# Patient Record
Sex: Female | Born: 1965 | Race: Black or African American | Hispanic: No | Marital: Married | Smoking: Former smoker
Health system: Southern US, Community
[De-identification: ages and names within clinical notes are randomized; demographics above are authoritative.]

## PROBLEM LIST (undated history)

## (undated) DIAGNOSIS — E119 Type 2 diabetes mellitus without complications: Secondary | ICD-10-CM

## (undated) DIAGNOSIS — J309 Allergic rhinitis, unspecified: Secondary | ICD-10-CM

## (undated) DIAGNOSIS — E785 Hyperlipidemia, unspecified: Secondary | ICD-10-CM

## (undated) DIAGNOSIS — M7710 Lateral epicondylitis, unspecified elbow: Secondary | ICD-10-CM

## (undated) DIAGNOSIS — A6000 Herpesviral infection of urogenital system, unspecified: Secondary | ICD-10-CM

## (undated) DIAGNOSIS — N920 Excessive and frequent menstruation with regular cycle: Secondary | ICD-10-CM

## (undated) DIAGNOSIS — Z124 Encounter for screening for malignant neoplasm of cervix: Secondary | ICD-10-CM

## (undated) DIAGNOSIS — E663 Overweight: Secondary | ICD-10-CM

## (undated) DIAGNOSIS — K449 Diaphragmatic hernia without obstruction or gangrene: Secondary | ICD-10-CM

## (undated) DIAGNOSIS — D219 Benign neoplasm of connective and other soft tissue, unspecified: Secondary | ICD-10-CM

## (undated) HISTORY — DX: Herpesviral infection of urogenital system, unspecified: A60.00

## (undated) HISTORY — DX: Allergic rhinitis, unspecified: J30.9

## (undated) HISTORY — DX: Hyperlipidemia, unspecified: E78.5

## (undated) HISTORY — PX: NASAL POLYP EXCISION: SHX2068

## (undated) HISTORY — PX: GYNECOLOGIC CRYOSURGERY: SHX857

## (undated) HISTORY — DX: Benign neoplasm of connective and other soft tissue, unspecified: D21.9

## (undated) HISTORY — DX: Excessive and frequent menstruation with regular cycle: N92.0

## (undated) HISTORY — DX: Lateral epicondylitis, unspecified elbow: M77.10

## (undated) HISTORY — DX: Encounter for screening for malignant neoplasm of cervix: Z12.4

## (undated) HISTORY — PX: TUBAL LIGATION: SHX77

## (undated) HISTORY — DX: Overweight: E66.3

## (undated) HISTORY — PX: ABDOMINAL HYSTERECTOMY: SHX81

## (undated) HISTORY — DX: Diaphragmatic hernia without obstruction or gangrene: K44.9

## (undated) HISTORY — PX: PELVIC LAPAROSCOPY: SHX162

---

## 2010-02-28 HISTORY — PX: COLONOSCOPY: SHX174

## 2011-03-01 LAB — HM COLONOSCOPY

## 2012-07-20 HISTORY — PX: ESOPHAGOGASTRODUODENOSCOPY: SHX1529

## 2012-08-27 ENCOUNTER — Emergency Department (HOSPITAL_BASED_OUTPATIENT_CLINIC_OR_DEPARTMENT_OTHER)
Admission: EM | Admit: 2012-08-27 | Discharge: 2012-08-28 | Disposition: A | Discharge status: Home / Self Care | Payer: BC Managed Care – PPO | Attending: Emergency Medicine | Admitting: Emergency Medicine

## 2012-08-27 ENCOUNTER — Encounter (HOSPITAL_BASED_OUTPATIENT_CLINIC_OR_DEPARTMENT_OTHER): Payer: Self-pay

## 2012-08-27 ENCOUNTER — Emergency Department (HOSPITAL_BASED_OUTPATIENT_CLINIC_OR_DEPARTMENT_OTHER): Payer: BC Managed Care – PPO

## 2012-08-27 DIAGNOSIS — S90129A Contusion of unspecified lesser toe(s) without damage to nail, initial encounter: Secondary | ICD-10-CM | POA: Insufficient documentation

## 2012-08-27 DIAGNOSIS — F17200 Nicotine dependence, unspecified, uncomplicated: Secondary | ICD-10-CM | POA: Insufficient documentation

## 2012-08-27 DIAGNOSIS — M719 Bursopathy, unspecified: Secondary | ICD-10-CM | POA: Insufficient documentation

## 2012-08-27 DIAGNOSIS — X58XXXA Exposure to other specified factors, initial encounter: Secondary | ICD-10-CM | POA: Insufficient documentation

## 2012-08-27 DIAGNOSIS — Y929 Unspecified place or not applicable: Secondary | ICD-10-CM | POA: Insufficient documentation

## 2012-08-27 DIAGNOSIS — Y9341 Activity, dancing: Secondary | ICD-10-CM | POA: Insufficient documentation

## 2012-08-27 DIAGNOSIS — M67919 Unspecified disorder of synovium and tendon, unspecified shoulder: Secondary | ICD-10-CM | POA: Insufficient documentation

## 2012-08-27 DIAGNOSIS — S90121A Contusion of right lesser toe(s) without damage to nail, initial encounter: Secondary | ICD-10-CM

## 2012-08-27 LAB — CBC WITH DIFFERENTIAL/PLATELET
Basophils Relative: 0 % (ref 0–1)
Eosinophils Absolute: 0.3 10*3/uL (ref 0.0–0.7)
HCT: 35 % — ABNORMAL LOW (ref 36.0–46.0)
Hemoglobin: 11.8 g/dL — ABNORMAL LOW (ref 12.0–15.0)
MCH: 28.5 pg (ref 26.0–34.0)
MCHC: 33.7 g/dL (ref 30.0–36.0)
MCV: 84.5 fL (ref 78.0–100.0)
Monocytes Absolute: 0.6 10*3/uL (ref 0.1–1.0)
Monocytes Relative: 8 % (ref 3–12)
Neutro Abs: 4 10*3/uL (ref 1.7–7.7)
RBC: 4.14 MIL/uL (ref 3.87–5.11)
WBC: 7.3 10*3/uL (ref 4.0–10.5)

## 2012-08-27 NOTE — ED Notes (Signed)
Right great toe pain x 2-3 months-? Injured during dance-c/o pain to right arm x 2 weeks-denies injury

## 2012-08-28 LAB — BASIC METABOLIC PANEL
BUN: 14 mg/dL (ref 6–23)
GFR calc non Af Amer: 74 mL/min — ABNORMAL LOW (ref >90)
Glucose, Bld: 105 mg/dL — ABNORMAL HIGH (ref 70–99)
Potassium: 3.7 mEq/L (ref 3.5–5.1)

## 2012-08-28 MED ORDER — HYDROCODONE-ACETAMINOPHEN 5-325 MG PO TABS: 2.0000 | ORAL_TABLET | ORAL | Status: DC | PRN

## 2012-08-28 NOTE — ED Provider Notes (Signed)
Medical screening examination/treatment/procedure(s) were performed by non-physician practitioner and as supervising physician I was immediately available for consultation/collaboration.   Glynn Octave, MD 08/28/12 [phone]

## 2012-08-28 NOTE — ED Provider Notes (Signed)
History    CSN: [id number] Arrival date & time 08/27/12  2134  First MD Initiated Contact with Patient 08/27/12 2221     Chief Complaint  Patient presents with  . Toe Pain  . Arm Pain   (Consider location/radiation/quality/duration/timing/severity/associated sxs/prior Treatment) Patient is a 47 y.o. female presenting with toe pain and arm pain. The history is provided by the patient. No language interpreter was used.  Toe Pain Chronicity: 2 months  The current episode started today. The problem occurs constantly. The problem has been gradually worsening. Associated symptoms include myalgias. Nothing aggravates the symptoms. She has tried nothing for the symptoms. The treatment provided moderate relief.  Arm Pain Associated symptoms include myalgias.  Pt reports arm pain for the past 2 weeks Pt reports soreness with strightening out arm.  Pt complains of pain in elbow with moving.  No injury of elbow.   Pt reports she injured toe dancing 2 months ago damcing History reviewed. No pertinent past medical history. History reviewed. No pertinent past surgical history. No family history on file. History  Substance Use Topics  . Smoking status: Current Some Day Smoker  . Smokeless tobacco: Not on file  . Alcohol Use: No   OB History   Grav Para Term Preterm Abortions TAB SAB Ect Mult Living                 Review of Systems  Musculoskeletal: Positive for myalgias.  All other systems reviewed and are negative.    Allergies  Advil; Aspirin; and Darvon  Home Medications  No current outpatient prescriptions on file. BP 103/65  Pulse 84  Temp(Src) 99 F (37.2 C) (Oral)  Resp 18  Ht 5\' 6"  (1.676 m)  Wt 180 lb (81.647 kg)  BMI 29.07 kg/m2  SpO2 99%  LMP 08/06/2012 Physical Exam  Nursing note and vitals reviewed. Constitutional: She is oriented to person, place, and time. She appears well-developed.  HENT:  Head: Normocephalic and atraumatic.  Eyes: Conjunctivae are  normal. Pupils are equal, round, and reactive to light.  Neck: Normal range of motion.  Cardiovascular: Normal rate.   Musculoskeletal: She exhibits tenderness.  Tender right toe,  Tender right elbow from,  nv and ns intact,  Pain with range of motion  Neurological: She is alert and oriented to person, place, and time.  Skin: Skin is warm.  Psychiatric: She has a normal mood and affect.    ED Course  Procedures (including critical care time) Labs Reviewed  CBC WITH DIFFERENTIAL - Abnormal; Notable for the following:    Hemoglobin 11.8 (*)    HCT 35.0 (*)    RDW 16.4 (*)    All other components within normal limits  D-DIMER, QUANTITATIVE  BASIC METABOLIC PANEL   Dg Toe Great Right  08/27/2012   *RADIOLOGY REPORT*  Clinical Data: Right great toe pain for 2-3 months.  RIGHT GREAT TOE  Comparison: None.  Findings: Imaged bones, joints and soft tissues appear normal.  IMPRESSION: Negative exam.   Original Report Authenticated By: Holley Dexter, M.D.   1. Tendonitis of shoulder, right   2. Contusion of toe of right foot, initial encounter     MDM  Pt worried about having a blood clot.  ddimer normal,  I suspect tendonitis.   Toe no fracture.   Pt given rx for  hydrocodone   Pt advised to follow up with Dr. Pearletha Forge for recheck in 1 week.  Sling for arm,  ice  Lonia Skinner [place],  PA-C 08/28/12 1610

## 2012-11-06 DIAGNOSIS — Z124 Encounter for screening for malignant neoplasm of cervix: Secondary | ICD-10-CM

## 2012-11-06 HISTORY — DX: Encounter for screening for malignant neoplasm of cervix: Z12.4

## 2012-12-29 LAB — HM MAMMOGRAPHY

## 2012-12-29 LAB — HM PAP SMEAR

## 2013-03-21 ENCOUNTER — Ambulatory Visit (INDEPENDENT_AMBULATORY_CARE_PROVIDER_SITE_OTHER): Payer: BC Managed Care – PPO | Admitting: Family Medicine

## 2013-03-21 ENCOUNTER — Encounter: Payer: Self-pay | Admitting: Family Medicine

## 2013-03-21 VITALS — BP 109/77 | HR 76 | Resp 16 | Ht 68.75 in | Wt 198.0 lb

## 2013-03-21 DIAGNOSIS — R1012 Left upper quadrant pain: Secondary | ICD-10-CM

## 2013-03-21 DIAGNOSIS — N760 Acute vaginitis: Secondary | ICD-10-CM

## 2013-03-21 NOTE — Progress Notes (Signed)
Subjective:    Patient ID: Anne [name], female    DOB: March 27, 1965, 48 y.o.   MRN: [id number]  HPI  Anne [name] is here today to establish care with our practice.  Her assistant found our practice and scheduled her appointment.  She previously received her care from several of the providers at [facility] . She has not been entirely satisfied with the care she has received there in the past and wanted to get an additional opinion elsewhere.  Her biggest concern/complaint is some discomfort she has had in her upper left abdomen for several years. She has had numerous tests done and nothing significant has been found.  She says that she has been treated for H. Pylori a couple of times.  She has also been diagnosed with a hiatal hernia.  She has been frustrated because several providers have suggested that her LUQ pain is related somehow to the fat present on her abdomen.  She says that today the pain is radiating to her back. She has also been having some very heavy periods.  She had a transabdominal and transvaginal U/S of the pelvis on 10/11/12 which showed some uterine fibroid with the largest one measuring 1.7 x 1.3 x 1.6 cm. The ovaries/adnexa were WNL.      Review of Systems  Gastrointestinal: Positive for abdominal pain.       LUQ   Genitourinary: Positive for vaginal bleeding and pelvic pain.  All other systems reviewed and are negative.    Past Medical History  Diagnosis Date  . Hiatal hernia   . Pap smear for cervical cancer screening 11/06/2012    HPV (Negative) [facility]  . Allergic rhinitis   . Genital herpes   . Hyperlipidemia   . Lateral epicondylitis   . Overweight   . Polymenorrhea   . Fibroids      Past Surgical History  Procedure Laterality Date  . Pelvic laparoscopy      Uterine  . Nasal polyp excision    . Gynecologic cryosurgery    . Esophagogastroduodenoscopy  07/20/2012  . Tubal ligation    . 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  She quit on 02/20/13.   Alcohol Use:  Rarely    Drug Use:  None   Diet:  Regular    Exercise:  3 x per week Paediatric nurse)    Hobbies: Dance                  Family History  Problem Relation Age of Onset  . Seizures Mother   . Stroke Brother   . Arthritis Maternal Grandmother   . Diabetes Maternal Grandmother   . Heart disease Maternal Grandmother   . Hyperlipidemia Maternal Grandmother   . Hypertension Maternal Grandmother   . Congestive Heart Failure Maternal Grandfather   . Diabetes Maternal Grandfather       Allergies  Allergen Reactions  . Advil [Ibuprofen] Rash  . Aspirin Rash  . Darvon [Propoxyphene Hcl] Rash  . Penicillins Rash       Objective:  Physical Exam  Constitutional: No distress.  HENT:  Nose: Nose normal.  Mouth/Throat: Oropharynx is clear and moist. Mucous membranes are dry.  Eyes: No scleral icterus.  Neck: Neck supple.  Cardiovascular: Normal rate, regular rhythm and normal heart sounds.   Pulmonary/Chest: Effort normal and breath sounds normal.  Abdominal: Soft. Bowel sounds are normal. She exhibits no distension and no mass. There is tenderness (Mild discomfort in LUQ. ). There is no rebound and no guarding.  Musculoskeletal: She exhibits no edema.  Lymphadenopathy:    She has no cervical adenopathy.  Neurological: She is alert.  Skin: Skin is warm and dry. She is not diaphoretic.  Psychiatric: She has a normal mood and affect. Her behavior is normal. Judgment and thought content normal.          Assessment & Plan:

## 2013-03-21 NOTE — Patient Instructions (Signed)
1)  Abdominal Pain - ? IBS vs Splenic Flexure; Try Align (1 + month)  2)  Vaginitis - RePhresh Products  3)  GYN - We're getting records to see what has been done and what needs to be done.    Diet and Irritable Bowel Syndrome  No cure has been found for irritable bowel syndrome (IBS). Many options are available to treat the symptoms. Your caregiver will give you the best treatments available for your symptoms. He or she will also encourage you to manage stress and to make changes to your diet. You need to work with your caregiver and Registered Dietician to find the best combination of medicine, diet, counseling, and support to control your symptoms. The following are some diet suggestions. FOODS THAT MAKE IBS WORSE  Fatty foods, such as [place] fries.  Milk products, such as cheese or ice cream.  Chocolate.  Alcohol.  Caffeine (found in coffee and some sodas).  Carbonated drinks, such as soda. If certain foods cause symptoms, you should eat less of them or stop eating them. FOOD JOURNAL   Keep a journal of the foods that seem to cause distress. Write down:  What you are eating during the day and when.  What problems you are having after eating.  When the symptoms occur in relation to your meals.  What foods always make you feel badly.  Take your notes with you to your caregiver to see if you should stop eating certain foods. FOODS THAT MAKE IBS BETTER Fiber reduces IBS symptoms, especially constipation, because it makes stools soft, bulky, and easier to pass. Fiber is found in bran, bread, cereal, beans, fruit, and vegetables. Examples of foods with fiber include:  Apples.  Peaches.  Pears.  Berries.  Figs.  Broccoli, raw.  Cabbage.  Carrots.  Raw peas.  Kidney beans.  Lima beans.  Whole-grain bread.  Whole-grain cereal. Add foods with fiber to your diet a little at a time. This will let your body get used to them. Too much fiber at once might cause  gas and swelling of your abdomen. This can trigger symptoms in a person with IBS. Caregivers usually recommend a diet with enough fiber to produce soft, painless bowel movements. High fiber diets may cause gas and bloating. However, these symptoms often go away within a few weeks, as your body adjusts. In many cases, dietary fiber may lessen IBS symptoms, particularly constipation. However, it may not help pain or diarrhea. High fiber diets keep the colon mildly enlarged (distended) with the added fiber. This may help prevent spasms in the colon. Some forms of fiber also keep water in the stool, thereby preventing hard stools that are difficult to pass.  Besides telling you to eat more foods with fiber, your caregiver may also tell you to get more fiber by taking a fiber pill or drinking water mixed with a special high fiber powder. An example of this is a natural fiber laxative containing psyllium seed.  TIPS  Large meals can cause cramping and diarrhea in people with IBS. If this happens to you, try eating 4 or 5 small meals a day, or try eating less at each of your usual 3 meals. It may also help if your meals are low in fat and high in carbohydrates. Examples of carbohydrates are pasta, rice, whole-grain breads and cereals, fruits, and vegetables.  If dairy products cause your symptoms to flare up, you can try eating less of those foods. You might be able to  handle yogurt better than other dairy products, because it contains bacteria that helps with digestion. Dairy products are an important source of calcium and other nutrients. If you need to avoid dairy products, be sure to talk with a Registered Dietitian about getting these nutrients through other food sources.  Drink enough water and fluids to keep your urine clear or pale yellow. This is important, especially if you have diarrhea. FOR MORE INFORMATION  International Foundation for Functional Gastrointestinal Disorders: www.iffgd.org  National  Digestive Diseases Information Clearinghouse: digestive.AmenCredit.is Document Released: 05/07/2003 Document Revised: 05/09/2011 Document Reviewed: 01/22/2007 [facility] Patient Information 2014 [place], [place].

## 2013-04-18 ENCOUNTER — Encounter: Payer: Self-pay | Admitting: Family Medicine

## 2013-04-19 ENCOUNTER — Ambulatory Visit (INDEPENDENT_AMBULATORY_CARE_PROVIDER_SITE_OTHER): Payer: BC Managed Care – PPO | Admitting: Family Medicine

## 2013-04-19 ENCOUNTER — Encounter: Payer: Self-pay | Admitting: Family Medicine

## 2013-04-19 ENCOUNTER — Ambulatory Visit (HOSPITAL_BASED_OUTPATIENT_CLINIC_OR_DEPARTMENT_OTHER)
Admission: RE | Admit: 2013-04-19 | Discharge: 2013-04-19 | Disposition: A | Discharge status: Home / Self Care | Payer: BC Managed Care – PPO | Source: Ambulatory Visit | Attending: Family Medicine | Admitting: Family Medicine

## 2013-04-19 VITALS — BP 108/76 | HR 75 | Resp 16 | Wt 197.0 lb

## 2013-04-19 DIAGNOSIS — R0602 Shortness of breath: Secondary | ICD-10-CM | POA: Insufficient documentation

## 2013-04-19 DIAGNOSIS — F411 Generalized anxiety disorder: Secondary | ICD-10-CM

## 2013-04-19 MED ORDER — HYDROXYZINE PAMOATE 25 MG PO CAPS: ORAL_CAPSULE | ORAL | Status: DC

## 2013-04-19 MED ORDER — SERTRALINE HCL 50 MG PO TABS
50.0000 mg | ORAL_TABLET | Freq: Every day | ORAL | Status: DC
Start: 2013-04-19 — End: 2013-05-27

## 2013-04-19 MED ORDER — UMECLIDINIUM-VILANTEROL 62.5-25 MCG/INH IN AEPB
1.0000 | INHALATION_SPRAY | Freq: Every day | RESPIRATORY_TRACT | Status: AC
Start: 2013-04-19 — End: 2014-04-19

## 2013-04-19 NOTE — Patient Instructions (Addendum)
1)  Panic Attack - Zoloft daily vs Vistaril as needed or both (Zoloft - start with 1/2 tab for 6 days then go to 1 tab - the next month take 1 or 2)  F/U in 6 weeks   2)  Shortness of Breath - Try the Anoro 1 puff daily  Panic Attacks Panic attacks are sudden, short-livedsurges of severe anxiety, fear, or discomfort. They may occur for no reason when you are relaxed, when you are anxious, or when you are sleeping. Panic attacks may occur for a number of reasons:   Healthy people occasionally have panic attacks in extreme, life-threatening situations, such as war or natural disasters. Normal anxiety is a protective mechanism of the body that helps [place] react to danger (fight or flight response).  Panic attacks are often seen with anxiety disorders, such as panic disorder, social anxiety disorder, generalized anxiety disorder, and phobias. Anxiety disorders cause excessive or uncontrollable anxiety. They may interfere with your relationships or other life activities.  Panic attacks are sometimes seen with other mental illnesses such as depression and posttraumatic stress disorder.  Certain medical conditions, prescription medicines, and drugs of abuse can cause panic attacks. SYMPTOMS  Panic attacks start suddenly, peak within 20 minutes, and are accompanied by four or more of the following symptoms:  Pounding heart or fast heart rate (palpitations).  Sweating.  Trembling or shaking.  Shortness of breath or feeling smothered.  Feeling choked.  Chest pain or discomfort.  Nausea or strange feeling in your stomach.  Dizziness, lightheadedness, or feeling like you will faint.  Chills or hot flushes.  Numbness or tingling in your lips or hands and feet.  Feeling that things are not real or feeling that you are not yourself.  Fear of losing control or going crazy.  Fear of dying. Some of these symptoms can mimic serious medical conditions. For example, you may think you are having a  heart attack. Although panic attacks can be very scary, they are not life threatening. DIAGNOSIS  Panic attacks are diagnosed through an assessment by your health care provider. Your health care provider will ask questions about your symptoms, such as where and when they occurred. Your health care provider will also ask about your medical history and use of alcohol and drugs, including prescription medicines. Your health care provider may order blood tests or other studies to rule out a serious medical condition. Your health care provider may refer you to a mental health professional for further evaluation. TREATMENT   Most healthy people who have one or two panic attacks in an extreme, life-threatening situation will not require treatment.  The treatment for panic attacks associated with anxiety disorders or other mental illness typically involves counseling with a mental health professional, medicine, or a combination of both. Your health care provider will help determine what treatment is best for you.  Panic attacks due to physical illness usually goes away with treatment of the illness. If prescription medicine is causing panic attacks, talk with your health care provider about stopping the medicine, decreasing the dose, or substituting another medicine.  Panic attacks due to alcohol or drug abuse goes away with abstinence. Some adults need professional help in order to stop drinking or using drugs. HOME CARE INSTRUCTIONS   Take all your medicines as prescribed.   Check with your health care provider before starting new prescription or over-the-counter medicines.  Keep all follow up appointments with your health care provider. SEEK MEDICAL CARE IF:  You are  not able to take your medicines as prescribed.  Your symptoms do not improve or get worse. SEEK IMMEDIATE MEDICAL CARE IF:   You experience panic attack symptoms that are different than your usual symptoms.  You have serious  thoughts about hurting yourself or others.  You are taking medicine for panic attacks and have a serious side effect. MAKE SURE YOU:  Understand these instructions.  Will watch your condition.  Will get help right away if you are not doing well or get worse. Document Released: 02/14/2005 Document Revised: 12/05/2012 Document Reviewed: 09/28/2012 [facility] Patient Information 2014 [place].  Smoking Cessation Quitting smoking is important to your health and has many advantages. However, it is not always easy to quit since nicotine is a very addictive drug. Often times, people try 3 times or more before being able to quit. This document explains the best ways for you to prepare to quit smoking. Quitting takes hard work and a lot of effort, but you can do it. ADVANTAGES OF QUITTING SMOKING  You will live longer, feel better, and live better.  Your body will feel the impact of quitting smoking almost immediately.  Within 20 minutes, blood pressure decreases. Your pulse returns to its normal level.  After 8 hours, carbon monoxide levels in the blood return to normal. Your oxygen level increases.  After 24 hours, the chance of having a heart attack starts to decrease. Your breath, hair, and body stop smelling like smoke.  After 48 hours, damaged nerve endings begin to recover. Your sense of taste and smell improve.  After 72 hours, the body is virtually free of nicotine. Your bronchial tubes relax and breathing becomes easier.  After 2 to 12 weeks, lungs can hold more air. Exercise becomes easier and circulation improves.  The risk of having a heart attack, stroke, cancer, or lung disease is greatly reduced.  After 1 year, the risk of coronary heart disease is cut in half.  After 5 years, the risk of stroke falls to the same as a nonsmoker.  After 10 years, the risk of lung cancer is cut in half and the risk of other cancers decreases significantly.  After 15 years, the risk  of coronary heart disease drops, usually to the level of a nonsmoker.  If you are pregnant, quitting smoking will improve your chances of having a healthy baby.  The people you live with, especially any children, will be healthier.  You will have extra money to spend on things other than cigarettes. QUESTIONS TO THINK ABOUT BEFORE ATTEMPTING TO QUIT You may want to talk about your answers with your caregiver.  Why do you want to quit?  If you tried to quit in the past, what helped and what did not?  What will be the most difficult situations for you after you quit? How will you plan to handle them?  Who can help you through the tough times? Your family? Friends? A caregiver?  What pleasures do you get from smoking? What ways can you still get pleasure if you quit? Here are some questions to ask your caregiver:  How can you help me to be successful at quitting?  What medicine do you think would be best for me and how should I take it?  What should I do if I need more help?  What is smoking withdrawal like? How can I get information on withdrawal? GET READY  Set a quit date.  Change your environment by getting rid of all cigarettes,  ashtrays, matches, and lighters in your home, car, or work. Do not let people smoke in your home.  Review your past attempts to quit. Think about what worked and what did not. GET SUPPORT AND ENCOURAGEMENT You have a better chance of being successful if you have help. You can get support in many ways.  Tell your family, friends, and co-workers that you are going to quit and need their support. Ask them not to smoke around you.  Get individual, group, or telephone counseling and support. Programs are available at [employer] and health centers. Call your local health department for information about programs in your area.  Spiritual beliefs and practices may help some smokers quit.  Download a "quit meter" on your computer to keep track of  quit statistics, such as how long you have gone without smoking, cigarettes not smoked, and money saved.  Get a self-help book about quitting smoking and staying off of tobacco. [place] yourself from urges to smoke. Talk to someone, go for a walk, or occupy your time with a task.  Change your normal routine. Take a different route to work. Drink tea instead of coffee. Eat breakfast in a different place.  Reduce your stress. Take a hot bath, exercise, or read a book.  Plan something enjoyable to do every day. Reward yourself for not smoking.  Explore interactive web-based programs that specialize in helping you quit. GET MEDICINE AND USE IT CORRECTLY Medicines can help you stop smoking and decrease the urge to smoke. Combining medicine with the above behavioral methods and support can greatly increase your chances of successfully quitting smoking.  Nicotine replacement therapy helps deliver nicotine to your body without the negative effects and risks of smoking. Nicotine replacement therapy includes nicotine gum, lozenges, inhalers, nasal sprays, and skin patches. Some may be available over-the-counter and others require a prescription.  Antidepressant medicine helps people abstain from smoking, but how this works is unknown. This medicine is available by prescription.  Nicotinic receptor partial agonist medicine simulates the effect of nicotine in your brain. This medicine is available by prescription. Ask your caregiver for advice about which medicines to use and how to use them based on your health history. Your caregiver will tell you what side effects to look out for if you choose to be on a medicine or therapy. Carefully read the information on the package. Do not use any other product containing nicotine while using a nicotine replacement product.  RELAPSE OR DIFFICULT SITUATIONS Most relapses occur within the first 3 months after quitting. Do not be  discouraged if you start smoking again. Remember, most people try several times before finally quitting. You may have symptoms of withdrawal because your body is used to nicotine. You may crave cigarettes, be irritable, feel very hungry, cough often, get headaches, or have difficulty concentrating. The withdrawal symptoms are only temporary. They are strongest when you first quit, but they will go away within 10 14 days. To reduce the chances of relapse, try to:  Avoid drinking alcohol. Drinking lowers your chances of successfully quitting.  Reduce the amount of caffeine you consume. Once you quit smoking, the amount of caffeine in your body increases and can give you symptoms, such as a rapid heartbeat, sweating, and anxiety.  Avoid smokers because they can make you want to smoke.  Do not let weight gain distract you. Many smokers will gain weight when they quit, usually less than 10 pounds. Eat a  healthy diet and stay active. You can always lose the weight gained after you quit.  Find ways to improve your mood other than smoking. FOR MORE INFORMATION  www.smokefree.gov  Document Released: 02/08/2001 Document Revised: 08/16/2011 Document Reviewed: 05/26/2011 [facility] Patient Information 2014 [place], [place].

## 2013-04-19 NOTE — Progress Notes (Signed)
Subjective:    Patient ID: Anne [name], female    DOB: 12/27/1965, 48 y.o.   MRN: [id number]    HPI  Anne [name] is here today to discuss a couple of issues.  She has been feeling short of breath over the past couple of weeks.  She has had URI symptoms and has been using Vick's Vapor Rub and a humidifier to help with her chest symptoms.  She has noticed that these symptoms seem to be worse when she is in stressful situations like a meeting.  She has also experienced several episodes of her heart racing and tingling in her extremities and she wonders if these episodes are actually panic attacks.  She quit smoking two months ago and has also wondered if these episodes are related to her quitting smoking "cold [place]".     Review of Systems  Constitutional: Negative for fever and chills.  HENT: Positive for congestion.   Respiratory: Positive for chest tightness and shortness of breath. Negative for wheezing.   Cardiovascular: Positive for palpitations. Negative for chest pain.  Endocrine: Positive for cold intolerance.  Neurological: Positive for light-headedness.  Psychiatric/Behavioral: The patient is nervous/anxious.      Past Medical History  Diagnosis Date  . Hiatal hernia   . Pap smear for cervical cancer screening 11/06/2012    HPV (Negative) [facility]  . Allergic rhinitis   . Genital herpes   . Hyperlipidemia   . Lateral epicondylitis   . Overweight   . Polymenorrhea   . Fibroids      Past Surgical History  Procedure Laterality Date  . Pelvic laparoscopy      Uterine  . Nasal polyp excision    . Gynecologic cryosurgery    . Esophagogastroduodenoscopy  07/20/2012  . Tubal ligation    . 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  She quit on 02/20/13.   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 Seizures Mother   . Stroke Brother   . Arthritis Maternal Grandmother   . Diabetes Maternal Grandmother   . Heart disease Maternal Grandmother   . Hyperlipidemia Maternal Grandmother   . Hypertension Maternal Grandmother   . Congestive Heart Failure Maternal Grandfather   . Diabetes Maternal Grandfather      Allergies  Allergen Reactions  . Advil [Ibuprofen] Rash  . Aspirin Rash  . Darvon [Propoxyphene Hcl] Rash  . Penicillins Rash       Objective:   Physical Exam  Constitutional: She appears well-nourished. No distress.  HENT:  Head: Normocephalic.  Mouth/Throat: No oropharyngeal exudate.  Eyes: Conjunctivae are normal. Right eye exhibits no discharge. Left eye exhibits no discharge.  Neck: Neck supple.  Cardiovascular: Normal rate, regular rhythm and normal heart sounds.  Exam  reveals no gallop and no friction rub.   No murmur heard. Pulmonary/Chest: Effort normal and breath sounds normal. She has no wheezes. She exhibits no tenderness.  Lymphadenopathy:    She has no cervical adenopathy.  Neurological: She is alert.  Skin: Skin is warm and dry. No rash noted.  Psychiatric: She has a normal mood and affect.      Assessment & Plan:    Anne [name] was seen today for shortness of breath.  Diagnoses and associated orders for this visit:  Anxiety state, unspecified Comments: Her symptoms are very consistent with panic attacks.  We discussed treating her occasionally with Vistaril vs daily with Zoloft.  If she starts the Zoloft, she is to take 25 mg for about a week then increase to 50 mg to 100 mg by the second month.  She is to follow up in 4-6 weeks if she starts on the Zoloft and we'll keep  her on either the 50 vs 100 mg.      - hydrOXYzine (VISTARIL) 25 MG capsule; Take 1 capsule as needed for increased anxiety. - sertraline (ZOLOFT) 50 MG tablet; Take 1 tablet (50 mg total) by mouth daily.  Shortness of breath Comments: Anne [name] has a significant smoking history.  We'll send for a CXR to make sure that it looks normal.  She was given samples and a prescription for Anora to try to see if her breathing improves.   - Umeclidinium-Vilanterol 62.5-25 MCG/INH AEPB; Inhale 1 puff into the lungs daily.  - DG Chest 2 View (Normal)   TIME SPENT "FACE TO FACE" WITH PATIENT -  20 MINS

## 2013-04-20 ENCOUNTER — Encounter: Payer: Self-pay | Admitting: Family Medicine

## 2013-04-20 DIAGNOSIS — R0602 Shortness of breath: Secondary | ICD-10-CM | POA: Insufficient documentation

## 2013-04-20 DIAGNOSIS — F411 Generalized anxiety disorder: Secondary | ICD-10-CM | POA: Insufficient documentation

## 2013-04-22 ENCOUNTER — Telehealth: Payer: Self-pay | Admitting: *Deleted

## 2013-04-22 NOTE — Telephone Encounter (Signed)
Pt is aware of her chest x-ray results.  She said that her symptoms have improved.  She will contact our office if her symptoms come back. PG

## 2013-05-27 ENCOUNTER — Ambulatory Visit (INDEPENDENT_AMBULATORY_CARE_PROVIDER_SITE_OTHER): Payer: BC Managed Care – PPO | Admitting: Family Medicine

## 2013-05-27 ENCOUNTER — Encounter: Payer: Self-pay | Admitting: Family Medicine

## 2013-05-27 VITALS — BP 115/82 | HR 74 | Resp 16 | Wt 197.0 lb

## 2013-05-27 DIAGNOSIS — F411 Generalized anxiety disorder: Secondary | ICD-10-CM

## 2013-05-27 MED ORDER — HYDROXYZINE PAMOATE 25 MG PO CAPS: ORAL_CAPSULE | ORAL | Status: AC

## 2013-05-27 NOTE — Patient Instructions (Signed)
1)  Panic Attacks - Continue on the Vistaril as needed and try some counseling.  You may also want to try some Bikram (Hot) Yoga which helps with anxiety.     Panic Attacks Panic attacks are sudden, short-livedsurges of severe anxiety, fear, or discomfort. They may occur for no reason when you are relaxed, when you are anxious, or when you are sleeping. Panic attacks may occur for a number of reasons:   Healthy people occasionally have panic attacks in extreme, life-threatening situations, such as war or natural disasters. Normal anxiety is a protective mechanism of the body that helps [place] react to danger (fight or flight response).  Panic attacks are often seen with anxiety disorders, such as panic disorder, social anxiety disorder, generalized anxiety disorder, and phobias. Anxiety disorders cause excessive or uncontrollable anxiety. They may interfere with your relationships or other life activities.  Panic attacks are sometimes seen with other mental illnesses such as depression and posttraumatic stress disorder.  Certain medical conditions, prescription medicines, and drugs of abuse can cause panic attacks. SYMPTOMS  Panic attacks start suddenly, peak within 20 minutes, and are accompanied by four or more of the following symptoms:  Pounding heart or fast heart rate (palpitations).  Sweating.  Trembling or shaking.  Shortness of breath or feeling smothered.  Feeling choked.  Chest pain or discomfort.  Nausea or strange feeling in your stomach.  Dizziness, lightheadedness, or feeling like you will faint.  Chills or hot flushes.  Numbness or tingling in your lips or hands and feet.  Feeling that things are not real or feeling that you are not yourself.  Fear of losing control or going crazy.  Fear of dying. Some of these symptoms can mimic serious medical conditions. For example, you may think you are having a heart attack. Although panic attacks can be very scary, they  are not life threatening. DIAGNOSIS  Panic attacks are diagnosed through an assessment by your health care provider. Your health care provider will ask questions about your symptoms, such as where and when they occurred. Your health care provider will also ask about your medical history and use of alcohol and drugs, including prescription medicines. Your health care provider may order blood tests or other studies to rule out a serious medical condition. Your health care provider may refer you to a mental health professional for further evaluation. TREATMENT   Most healthy people who have one or two panic attacks in an extreme, life-threatening situation will not require treatment.  The treatment for panic attacks associated with anxiety disorders or other mental illness typically involves counseling with a mental health professional, medicine, or a combination of both. Your health care provider will help determine what treatment is best for you.  Panic attacks due to physical illness usually goes away with treatment of the illness. If prescription medicine is causing panic attacks, talk with your health care provider about stopping the medicine, decreasing the dose, or substituting another medicine.  Panic attacks due to alcohol or drug abuse goes away with abstinence. Some adults need professional help in order to stop drinking or using drugs. HOME CARE INSTRUCTIONS   Take all your medicines as prescribed.   Check with your health care provider before starting new prescription or over-the-counter medicines.  Keep all follow up appointments with your health care provider. SEEK MEDICAL CARE IF:  You are not able to take your medicines as prescribed.  Your symptoms do not improve or get worse. [place]  IF:   You experience panic attack symptoms that are different than your usual symptoms.  You have serious thoughts about hurting yourself or others.  You are taking  medicine for panic attacks and have a serious side effect. MAKE SURE YOU:  Understand these instructions.  Will watch your condition.  Will get help right away if you are not doing well or get worse. Document Released: 02/14/2005 Document Revised: 12/05/2012 Document Reviewed: 09/28/2012 [facility] Patient Information 2014 [place].

## 2013-05-27 NOTE — Progress Notes (Signed)
Subjective:    Patient ID: Anne [name], female    DOB: 03/21/1965, 48 y.o.   MRN: [id number]  HPI  Anne [name] is here today her anxiety treatment.  She was given prescriptions for Zoloft and Vistaril at her last visit.  She was researching Zoloft and was concerned about the side effects so she decided not to take it.  She currently is only taking Vistaril as needed for increased anxiety.  She feels that Vistaril has helped her and would like to remain on it.     Review of Systems  Psychiatric/Behavioral: The patient is not nervous/anxious.      Past Medical History  Diagnosis Date  . Hiatal hernia   . Pap smear for cervical cancer screening 11/06/2012    HPV (Negative) [facility]  . Allergic rhinitis   . Genital herpes   . Hyperlipidemia   . Lateral epicondylitis   . Overweight   . Polymenorrhea   . Fibroids      Past Surgical History  Procedure Laterality Date  . Pelvic laparoscopy      Uterine  . Nasal polyp excision    . Gynecologic cryosurgery    . Esophagogastroduodenoscopy  07/20/2012  . Tubal ligation    . 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  She quit on 02/20/13.   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 Seizures Mother   . Stroke Brother   . Arthritis Maternal Grandmother   . Diabetes Maternal Grandmother   . Heart disease Maternal Grandmother   . Hyperlipidemia Maternal Grandmother   . Hypertension Maternal Grandmother   . Congestive Heart Failure Maternal Grandfather   . Diabetes Maternal  Grandfather      Current Outpatient Prescriptions on File Prior to Visit  Medication Sig Dispense Refill  . Umeclidinium-Vilanterol 62.5-25 MCG/INH AEPB Inhale 1 puff into the lungs daily.  1 each  11   No current facility-administered medications on file prior to visit.     Allergies  Allergen Reactions  . Advil [Ibuprofen] Rash  . Aspirin Rash  . Darvon [Propoxyphene Hcl] Rash  . Penicillins Rash      Objective:   Physical Exam  Constitutional: She is oriented to person, place, and time. She appears well-nourished. No distress.  Eyes: Conjunctivae are normal. No scleral icterus.  Neck: Neck supple. No thyromegaly present.  Cardiovascular: Normal rate, regular rhythm and normal heart sounds.   Pulmonary/Chest: Breath sounds normal. No respiratory distress.  Neurological: She is alert and oriented to person, place, and time.  Skin: Skin is warm and dry.  Psychiatric: She has a normal mood and affect. Her behavior is normal. Judgment and thought content normal.      Assessment & Plan:    Anne [name] was  seen today for medication management.  Diagnoses and associated orders for this visit:  Anxiety state, unspecified Comments: We discussed her anxiety/panic attacks. She would prefer to take Vistaril as needed instead of Zoloft daily.    - hydrOXYzine (VISTARIL) 25 MG capsule; Take 1 capsule as needed for increased anxiety.

## 2015-12-29 ENCOUNTER — Other Ambulatory Visit (HOSPITAL_COMMUNITY): Payer: Self-pay | Admitting: Urology

## 2015-12-29 DIAGNOSIS — D3500 Benign neoplasm of unspecified adrenal gland: Secondary | ICD-10-CM

## 2015-12-29 DIAGNOSIS — R109 Unspecified abdominal pain: Secondary | ICD-10-CM

## 2016-01-12 ENCOUNTER — Encounter (HOSPITAL_COMMUNITY): Payer: Managed Care, Other (non HMO)

## 2016-01-19 ENCOUNTER — Ambulatory Visit (INDEPENDENT_AMBULATORY_CARE_PROVIDER_SITE_OTHER): Payer: Managed Care, Other (non HMO) | Admitting: Internal Medicine

## 2016-01-19 ENCOUNTER — Encounter: Payer: Self-pay | Admitting: Internal Medicine

## 2016-01-19 VITALS — BP 132/84 | HR 91 | Ht 66.0 in | Wt 229.0 lb

## 2016-01-19 DIAGNOSIS — R0602 Shortness of breath: Secondary | ICD-10-CM

## 2016-01-19 DIAGNOSIS — K589 Irritable bowel syndrome without diarrhea: Secondary | ICD-10-CM | POA: Diagnosis not present

## 2016-01-19 NOTE — Patient Instructions (Addendum)
Weight control is simply a matter of calorie balance which needs to be tilted in your favor by eating less and exercising more.  To get the most out of exercise, you need to be continuously aware that you are short of breath, but never out of breath, for 30 minutes daily. As you improve, it will actually be easier for you to do the same amount of exercise  in  30 minutes so always push to the level where you are short of breath.  If this does not result in gradual weight reduction then I strongly recommend you see a nutritionist with a food diary x 2 weeks so that we can work out a negative calorie balance which is universally effective in steady weight loss programs.  Think of your calorie balance like you do your bank account where in this case you want the balance to go down so you must take in less calories than you burn up.  It's just that simple:  Hard to do, but easy to understand.  Good luck!    Classic subdiaphragmatic pain pattern suggests ibs:   = very limited distribution of pain locations, daytime, not exacerbated by ex or coughing, worse in sitting position, associated with generalized abd bloating, less likely  present supine due to the dome effect of the diaphragm is  canceled in that position. Frequently these patients have had multiple negative GI workups and CT scans.  Treatment consists of avoiding foods that cause gas (especially [place] food. Boiled eggs/beans and raw vegetables like spinach and salads)  and citrucel 1 heaping tsp twice daily with a large glass of water.  Pain should improve w/in 2 weeks     Your iron deficiency will need to be corrected before considering any further exercise testing so once it is if you are not happy with your exercise tolerance we need to schedule this for you thru this office

## 2016-01-19 NOTE — Assessment & Plan Note (Signed)
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

## 2016-01-19 NOTE — Progress Notes (Signed)
Subjective:    Patient ID: Anne [name], female    DOB: 1965/06/29,   MRN: [id number]  HPI  1 yobf quit smoking in 2014 at wt around 210 -215 with some doe and since then worse with neg w/u by [facility] in [place] and no better ex tol and downhill since then so referred to pulmonary clinic 01/19/2016 by Dr   Gaynelle Arabian whom she was seeing for LUQ pain x 2013   01/19/2016 1st [facility] office visit/ Markese Bloxham   Chief Complaint  Patient presents with  . PULMONARY CONSULT    Referred by Dr Gaynelle Arabian. Pt c/o pain in chest and SOB x 3-4 years.   doe intolent onset/ initially going up steps but gradually progressed also walking at [employer]. Hasn't walked at a mall in years no better ex tol after saba   LUQ x 4 years can last up 15 - 20 min   With sense it takes her breath away, worse with deep breath with extensive w/u in [place] including per pt CT chest (not in care everywhere) and neg resp to rx for gerd.  Admits diet is mostly now "plant food"   No obvious day to day or daytime variability or assoc excess/ purulent sputum or mucus plugs or hemoptysis or   chest tightness, subjective wheeze or overt sinus or hb symptoms. No unusual exp hx or h/o childhood pna/ asthma or knowledge of premature birth.  Sleeping ok without nocturnal  or early am exacerbation  of respiratory  c/o's or need for noct saba. Also denies any obvious fluctuation of symptoms with weather or environmental changes or other aggravating or alleviating factors except as outlined above   Current Medications, Allergies, Complete Past Medical History, Past Surgical History, Family History, and Social History were reviewed in [employer] record.          Review of Systems  Constitutional: Positive for unexpected weight change. Negative for fever.  HENT: Positive for congestion. Negative for dental problem, ear pain, nosebleeds, postnasal drip, rhinorrhea, sinus pressure, sneezing, sore throat and trouble  swallowing.   Eyes: Negative for redness and itching.  Respiratory: Positive for shortness of breath. Negative for cough, chest tightness and wheezing.   Cardiovascular: Positive for chest pain and palpitations ( irregular heartbeat). Negative for leg swelling.  Gastrointestinal: Positive for abdominal pain. Negative for nausea and vomiting.  Genitourinary: Negative for dysuria.  Musculoskeletal: Positive for arthralgias and joint swelling.  Skin: Negative for rash.  Neurological: Negative for headaches.  Hematological: Does not bruise/bleed easily.  Psychiatric/Behavioral: Negative for dysphoric mood. The patient is nervous/anxious.        Objective:   Physical Exam  amb bf nad   Wt Readings from Last 3 Encounters:  01/19/16 229 lb (103.9 kg)  05/27/13 197 lb (89.4 kg)  04/19/13 197 lb (89.4 kg)    Vital signs reviewed - Note on arrival 02 sats  99%% on RA     HEENT: nl dentition, turbinates, and oropharynx. Nl external ear canals without cough reflex   NECK :  without JVD/Nodes/TM/ nl carotid upstrokes bilaterally   LUNGS: no acc muscle use,  Nl contour chest which is clear to A and P bilaterally without cough on insp or exp maneuvers   CV:  RRR  no s3 or murmur or increase in P2, no edema   ABD:  soft  with nl inspiratory excursion in the supine position. No bruits or organomegaly, bowel sounds nl - mild superficial LUQ  tenderness    MS:  Nl gait/ ext warm without deformities, calf tenderness, cyanosis or clubbing No obvious joint restrictions   SKIN: warm and dry without lesions    NEURO:  alert, approp, nl sensorium with  no motor deficits      I personally reviewed images and agree with radiology impression as follows:  CXR:   12/10/15 wnl   hgb   12/11/15  = 9.3 with mcv 77 and ferritin 9.5      Assessment & Plan:

## 2016-01-19 NOTE — Assessment & Plan Note (Signed)
Body mass index is 36.96   No results found for: TSH   Contributing to gerd tendency/ doe/reviewed the need and the process to achieve and maintain neg calorie balance > defer f/u primary care including intermittently monitoring thyroid status

## 2016-01-19 NOTE — Assessment & Plan Note (Signed)
Spirometry 01/19/2016  Nl except mod truncation of exp loop s rx prior 01/19/2016  Walked RA x 3 laps @ 185 ft each stopped due to  End of study, fast pace, no sob or desat     Unable to reproduce this "constant" symptom present x years per pt and may be related to fe deficiency but clearly not a lung problem   Could consider cpst p fe def corrected and she makes a reasonable attempt at reconditioning

## 2019-02-20 ENCOUNTER — Emergency Department (HOSPITAL_BASED_OUTPATIENT_CLINIC_OR_DEPARTMENT_OTHER)
Admission: EM | Admit: 2019-02-20 | Discharge: 2019-02-20 | Disposition: A | Discharge status: Home / Self Care | Payer: No Typology Code available for payment source | Attending: Emergency Medicine | Admitting: Emergency Medicine

## 2019-02-20 ENCOUNTER — Encounter (HOSPITAL_BASED_OUTPATIENT_CLINIC_OR_DEPARTMENT_OTHER): Payer: Self-pay

## 2019-02-20 ENCOUNTER — Emergency Department (HOSPITAL_BASED_OUTPATIENT_CLINIC_OR_DEPARTMENT_OTHER): Payer: No Typology Code available for payment source

## 2019-02-20 ENCOUNTER — Other Ambulatory Visit: Payer: Self-pay

## 2019-02-20 DIAGNOSIS — Z79899 Other long term (current) drug therapy: Secondary | ICD-10-CM | POA: Insufficient documentation

## 2019-02-20 DIAGNOSIS — Z87891 Personal history of nicotine dependence: Secondary | ICD-10-CM | POA: Insufficient documentation

## 2019-02-20 DIAGNOSIS — E119 Type 2 diabetes mellitus without complications: Secondary | ICD-10-CM | POA: Diagnosis not present

## 2019-02-20 DIAGNOSIS — R079 Chest pain, unspecified: Secondary | ICD-10-CM | POA: Insufficient documentation

## 2019-02-20 HISTORY — DX: Type 2 diabetes mellitus without complications: E11.9

## 2019-02-20 LAB — BASIC METABOLIC PANEL
Anion gap: 11 (ref 5–15)
BUN: 13 mg/dL (ref 6–20)
CO2: 26 mmol/L (ref 22–32)
Calcium: 9.5 mg/dL (ref 8.9–10.3)
Chloride: 100 mmol/L (ref 98–111)
Creatinine, Ser: 0.82 mg/dL (ref 0.44–1.00)
GFR calc Af Amer: >60 mL/min (ref >60)
GFR calc non Af Amer: >60 mL/min (ref >60)
Glucose, Bld: 119 mg/dL — ABNORMAL HIGH (ref 70–99)
Potassium: 3.6 mmol/L (ref 3.5–5.1)
Sodium: 137 mmol/L (ref 135–145)

## 2019-02-20 LAB — CBC
HCT: 39.1 % (ref 36.0–46.0)
Hemoglobin: 12.3 g/dL (ref 12.0–15.0)
MCH: 28.9 pg (ref 26.0–34.0)
MCHC: 31.5 g/dL (ref 30.0–36.0)
MCV: 92 fL (ref 80.0–100.0)
Platelets: 251 10*3/uL (ref 150–400)
RBC: 4.25 MIL/uL (ref 3.87–5.11)
RDW: 13.4 % (ref 11.5–15.5)
WBC: 7.2 10*3/uL (ref 4.0–10.5)
nRBC: 0 % (ref 0.0–0.2)

## 2019-02-20 LAB — TROPONIN I (HIGH SENSITIVITY)
Troponin I (High Sensitivity): <2 ng/L (ref <18)
Troponin I (High Sensitivity): <2 ng/L (ref <18)

## 2019-02-20 LAB — HEPATIC FUNCTION PANEL
ALT: 20 U/L (ref 0–44)
AST: 20 U/L (ref 15–41)
Albumin: 4 g/dL (ref 3.5–5.0)
Alkaline Phosphatase: 103 U/L (ref 38–126)
Bilirubin, Direct: <0.1 mg/dL (ref 0.0–0.2)
Total Bilirubin: 0.4 mg/dL (ref 0.3–1.2)
Total Protein: 7.8 g/dL (ref 6.5–8.1)

## 2019-02-20 LAB — LIPASE, BLOOD: Lipase: 26 U/L (ref 11–51)

## 2019-02-20 MED ORDER — SODIUM CHLORIDE 0.9% FLUSH
3.0000 mL | Freq: Once | INTRAVENOUS | Status: DC
  Filled 2019-02-20: qty 3

## 2019-02-20 NOTE — ED Notes (Signed)
Patient transported to X-ray 

## 2019-02-20 NOTE — ED Provider Notes (Signed)
[facility] Provider Note   CSN: [id number] Arrival date & time: 02/20/19  1934     History Chief Complaint  Patient presents with  . Chest Pain    Anne [name] is a 53 y.o. female.  The history is provided by the patient.  Chest Pain Pain location:  Substernal area Pain quality: aching   Pain radiates to:  Does not radiate Pain severity:  Mild Onset quality:  Gradual Timing:  Constant Progression:  Unchanged Chronicity:  New Context: not breathing, not eating and not at rest   Relieved by:  Nothing Worsened by:  Nothing Associated symptoms: anxiety   Associated symptoms: no abdominal pain, no anorexia, no back pain, no cough, no fatigue, no fever, no lower extremity edema, no nausea, no palpitations, no shortness of breath, no vomiting and no weakness   Risk factors: diabetes mellitus   Risk factors: no coronary artery disease, no high cholesterol, no hypertension and no prior DVT/PE        Past Medical History:  Diagnosis Date  . Allergic rhinitis   . Diabetes mellitus without complication ([place])   . Fibroids   . Genital herpes   . Hiatal hernia   . Hyperlipidemia   . Lateral epicondylitis   . Overweight(278.02)   . Pap smear for cervical cancer screening 11/06/2012   HPV (Negative) [place]  . Polymenorrhea     Patient Active Problem List   Diagnosis Date Noted  . IBS (irritable bowel syndrome) 01/19/2016  . Morbid obesity due to excess calories ([place]) 01/19/2016  . Shortness of breath 04/20/2013  . Anxiety state, unspecified 04/20/2013    Past Surgical History:  Procedure Laterality Date  . ABDOMINAL HYSTERECTOMY    . COLONOSCOPY  2012   Normal   . ESOPHAGOGASTRODUODENOSCOPY  07/20/2012  . GYNECOLOGIC CRYOSURGERY    . NASAL POLYP EXCISION    . PELVIC LAPAROSCOPY     Uterine  . TUBAL LIGATION       OB History   No obstetric history on file.     Family History  Problem Relation Age of Onset  . Seizures  Mother   . Stroke Brother   . Arthritis Maternal Grandmother   . Diabetes Maternal Grandmother   . Heart disease Maternal Grandmother   . Hyperlipidemia Maternal Grandmother   . Hypertension Maternal Grandmother   . Congestive Heart Failure Maternal Grandfather   . Diabetes Maternal Grandfather     Social History   Tobacco Use  . Smoking status: Former Smoker    Packs/day: 1.50    Years: 30.00    Pack years: 45.00    Types: Cigarettes    Quit date: 02/20/2013    Years since quitting: 6.0  . Smokeless tobacco: Never Used  . Tobacco comment: She smoked 1-2 ppd off and on since the age of 13.    Substance Use Topics  . Alcohol use: No  . Drug use: No    Home Medications Prior to Admission medications   Medication Sig Start Date End Date Taking? Authorizing Provider  hydrOXYzine (VISTARIL) 25 MG capsule Take 25 mg by mouth daily. 01/16/16   [provider]  oxyCODONE-acetaminophen (PERCOCET/ROXICET) 5-325 MG tablet as directed. Pre-op medication - 2 tabs prior to procedure 01/11/16   [provider]  pantoprazole (PROTONIX) 40 MG tablet Take 40 mg by mouth daily. 12/12/15   [provider]    Allergies    Advil [ibuprofen], Aspirin, and Darvon [propoxyphene hcl]  Review  of Systems   Review of Systems  Constitutional: Negative for chills, fatigue and fever.  HENT: Negative for ear pain and sore throat.   Eyes: Negative for pain and visual disturbance.  Respiratory: Negative for cough and shortness of breath.   Cardiovascular: Positive for chest pain. Negative for palpitations.  Gastrointestinal: Negative for abdominal pain, anorexia, nausea and vomiting.  Genitourinary: Negative for dysuria and hematuria.  Musculoskeletal: Negative for arthralgias and back pain.  Skin: Negative for color change and rash.  Neurological: Negative for seizures, syncope and weakness.  All other systems reviewed and are negative.   Physical Exam Updated Vital  Signs  ED Triage Vitals  Enc Vitals Group     BP 02/20/19 1944 120/84     Pulse Rate 02/20/19 1944 79     Resp 02/20/19 1944 20     Temp 02/20/19 1944 98.2 F (36.8 C)     Temp Source 02/20/19 1944 Oral     SpO2 02/20/19 1944 100 %     Weight 02/20/19 1943 232 lb (105.2 kg)     Height 02/20/19 1943 5\' 6"  (1.676 m)     Head Circumference --      Peak Flow --      Pain Score 02/20/19 1941 7     Pain Loc --      Pain Edu? --      Excl. in [place]? --     Physical Exam Vitals and nursing note reviewed.  Constitutional:      General: She is not in acute distress.    Appearance: She is well-developed.  HENT:     Head: Normocephalic and atraumatic.  Eyes:     Extraocular Movements: Extraocular movements intact.     Conjunctiva/sclera: Conjunctivae normal.     Pupils: Pupils are equal, round, and reactive to light.  Cardiovascular:     Rate and Rhythm: Normal rate and regular rhythm.     Pulses:          Radial pulses are 2+ on the right side and 2+ on the left side.       Dorsalis pedis pulses are 2+ on the right side and 2+ on the left side.     Heart sounds: No murmur.  Pulmonary:     Effort: Pulmonary effort is normal. No respiratory distress.     Breath sounds: Normal breath sounds. No decreased breath sounds or wheezing.  Abdominal:     Palpations: Abdomen is soft.     Tenderness: There is no abdominal tenderness.  Musculoskeletal:        General: Normal range of motion.     Cervical back: Normal range of motion and neck supple.     Right lower leg: No edema.     Left lower leg: No edema.  Skin:    General: Skin is warm and dry.     Capillary Refill: Capillary refill takes less than 2 seconds.  Neurological:     General: No focal deficit present.     Mental Status: She is alert.  Psychiatric:        Mood and Affect: Mood normal.     ED Results / Procedures / Treatments   Labs (all labs ordered are listed, but only abnormal results are displayed) Labs Reviewed   BASIC METABOLIC PANEL - Abnormal; Notable for the following components:      Result Value   Glucose, Bld 119 (*)    All other components within normal limits  CBC  HEPATIC FUNCTION PANEL  LIPASE, BLOOD  TROPONIN I (HIGH SENSITIVITY)  TROPONIN I (HIGH SENSITIVITY)    EKG EKG Interpretation  Date/Time:  Wednesday February 20 2019 19:39:56 EST Ventricular Rate:  70 PR Interval:  178 QRS Duration: 78 QT Interval:  376 QTC Calculation: 406 R Axis:   48 Text Interpretation: Normal sinus rhythm Normal ECG Confirmed by Lennice Sites [phone]) on 02/20/2019 7:44:51 PM   Radiology DG Chest 2 View  Result Date: 02/20/2019 CLINICAL DATA:  53 year old female with chest pain. EXAM: CHEST - 2 VIEW COMPARISON:  Chest radiograph dated 10/31/2018. FINDINGS: The heart size and mediastinal contours are within normal limits. Both lungs are clear. The visualized skeletal structures are unremarkable. IMPRESSION: No active cardiopulmonary disease. Electronically Signed   By: Anner Crete M.D.   On: 02/20/2019 20:35    Procedures Procedures (including critical care time)  Medications Ordered in ED Medications  sodium chloride flush (NS) 0.9 % injection 3 mL (has no administration in time range)    ED Course  I have reviewed the triage vital signs and the nursing notes.  Pertinent labs & imaging results that were available during my care of the patient were reviewed by me and considered in my medical decision making (see chart for details).    MDM Rules/Calculators/A&P                      Rayonna Kriel is a 53 year old female with history of diabetes, reflux who presents to the ED with chest pain.  Patient with normal vitals.  No fever.  Pain ongoing constantly for about a week.  Possibly stress related.  EKG shows sinus rhythm.  No ischemic changes.  She denies any association of pain with food.  No reproducible tenderness on exam.  No PE or DVT risk factors.  Wells criteria 0 and doubt  PE.  Will get lab work including 2 troponins.  Patient has a heart score of 2.  Denies any infectious symptoms.  Does have follow-up next week with cardiology.  Will get cardiac enzymes, evaluate for anemia, electrolyte abnormalities.  Low risk heart score and will clear for ACS with 2 troponins.  Troponin negative x2.  Chest x-ray showed no signs of infection, no pneumothorax, no pleural effusion.  Patient with no significant anemia, leukocytosis, electrolyte abnormality, kidney injury.  Overall unlikely ACS.  Does have follow-up with cardiology next week and she could benefit from outpatient stress test.  Overall she is low risk for ACS.  No concern for PE.  Given return precautions and discharged from ED in good condition.  This chart was dictated using voice recognition software.  Despite best efforts to proofread,  errors can occur which can change the documentation meaning.    Final Clinical Impression(s) / ED Diagnoses Final diagnoses:  Nonspecific chest pain    Rx / DC Orders ED Discharge Orders    None       Lennice Sites, DO 02/20/19 2221

## 2019-02-20 NOTE — ED Triage Notes (Addendum)
C/o intermittent central CP started 12/18-denies fever/flu like sx-seen and sent from [facility]-steady gait

## 2019-06-13 ENCOUNTER — Ambulatory Visit: Payer: No Typology Code available for payment source | Attending: Internal Medicine

## 2019-06-13 DIAGNOSIS — Z23 Encounter for immunization: Secondary | ICD-10-CM

## 2019-06-13 NOTE — Progress Notes (Signed)
   Covid-19 Vaccination Clinic  Name:  Anne [name]    MRN: [id number] DOB: 1965-04-09  06/13/2019  [name] was observed post Covid-19 immunization for 15 minutes without incident. She was provided with Vaccine Information Sheet and instruction to access the V-Safe system.   [name] was instructed to call 911 with any severe reactions post vaccine: [name] Kitchen Difficulty breathing  . Swelling of face and throat  . A fast heartbeat  . A bad rash all over body  . Dizziness and weakness   Immunizations Administered    Name Date Dose VIS Date Route   Pfizer COVID-19 Vaccine 06/13/2019 12:11 PM 0.3 mL 02/08/2019 Intramuscular   Manufacturer: [place]   Lot: [id number]   [place]: [id number]

## 2019-07-10 ENCOUNTER — Ambulatory Visit: Payer: No Typology Code available for payment source | Attending: Internal Medicine

## 2019-07-10 DIAGNOSIS — Z23 Encounter for immunization: Secondary | ICD-10-CM

## 2019-07-10 NOTE — Progress Notes (Signed)
   Covid-19 Vaccination Clinic  Name:  Anne [name]    MRN: [id number] DOB: Apr 16, 1965  07/10/2019  Anne [name] was observed post Covid-19 immunization for 15 minutes without incident. She was provided with Vaccine Information Sheet and instruction to access the V-Safe system.   Anne [name] was instructed to call 911 with any severe reactions post vaccine: [name] Kitchen Difficulty breathing  . Swelling of face and throat  . A fast heartbeat  . A bad rash all over body  . Dizziness and weakness   Immunizations Administered    Name Date Dose VIS Date Route   Pfizer COVID-19 Vaccine 07/10/2019  8:29 AM 0.3 mL 04/24/2018 Intramuscular   Manufacturer: [place]   Lot: [id number]   [place]: [id number]

## 2020-02-14 ENCOUNTER — Ambulatory Visit: Payer: No Typology Code available for payment source | Attending: Internal Medicine

## 2020-02-14 DIAGNOSIS — Z23 Encounter for immunization: Secondary | ICD-10-CM

## 2020-02-14 NOTE — Progress Notes (Signed)
   Covid-19 Vaccination Clinic  Name:  Anne [name]    MRN: [id number] DOB: Oct 15, 1965  02/14/2020  [name] was observed post Covid-19 immunization for 15 minutes without incident. She was provided with Vaccine Information Sheet and instruction to access the V-Safe system.   [name] was instructed to call 911 with any severe reactions post vaccine: [name] Kitchen Difficulty breathing  . Swelling of face and throat  . A fast heartbeat  . A bad rash all over body  . Dizziness and weakness   Immunizations Administered    Name Date Dose VIS Date Route   Pfizer COVID-19 Vaccine 02/14/2020  1:57 PM 0.3 mL 12/18/2019 Intramuscular   Manufacturer: [place]   Lot: [id number]   [place]: [id number]

## 2020-05-31 IMAGING — DX DG CHEST 2V
2 series · 2 of 2 positions shown · non-contrast
Comparison: Chest radiograph dated 10/31/2018.

CLINICAL DATA: 53-year-old female with chest pain.

EXAM:
CHEST - 2 VIEW

[chest pa]
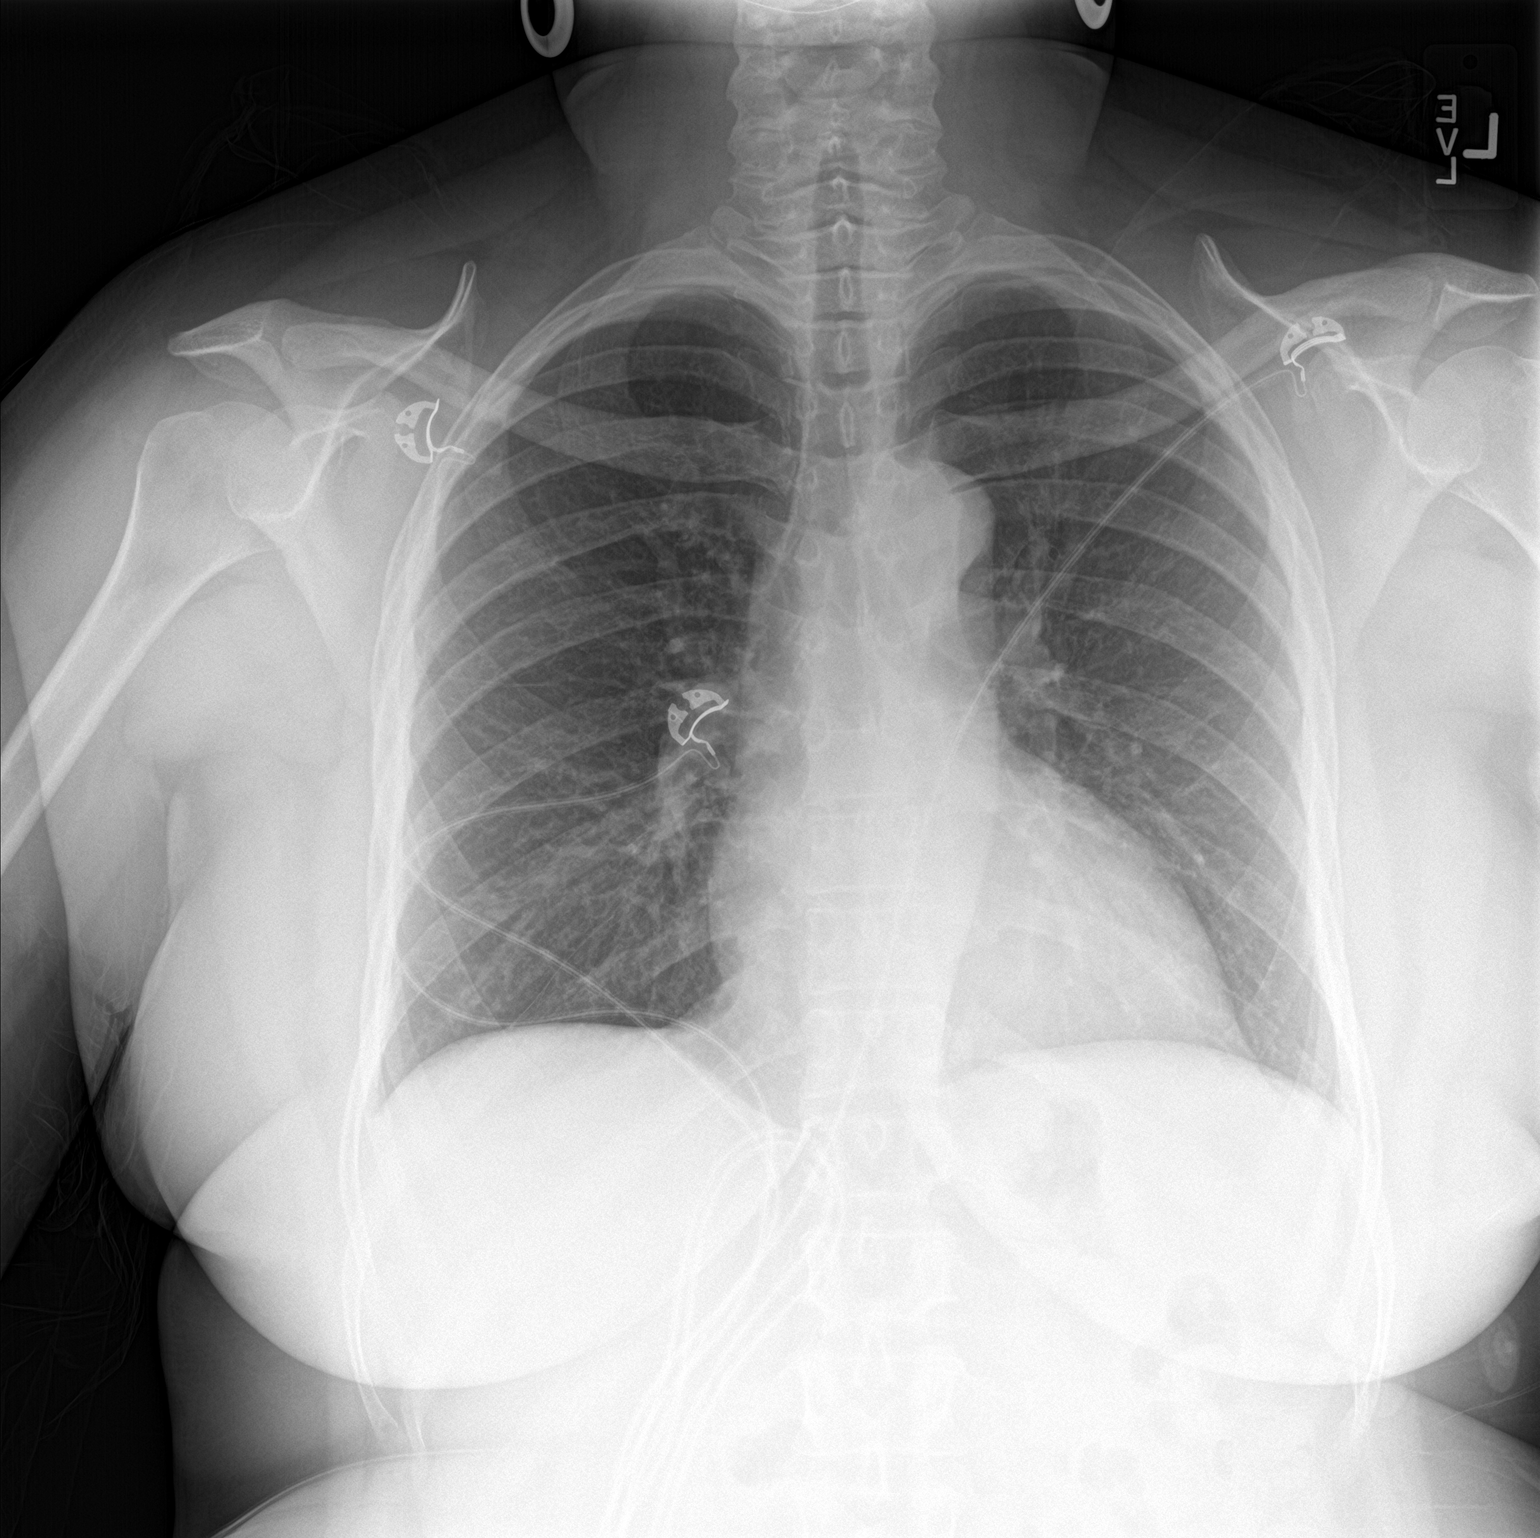

[chest lat]
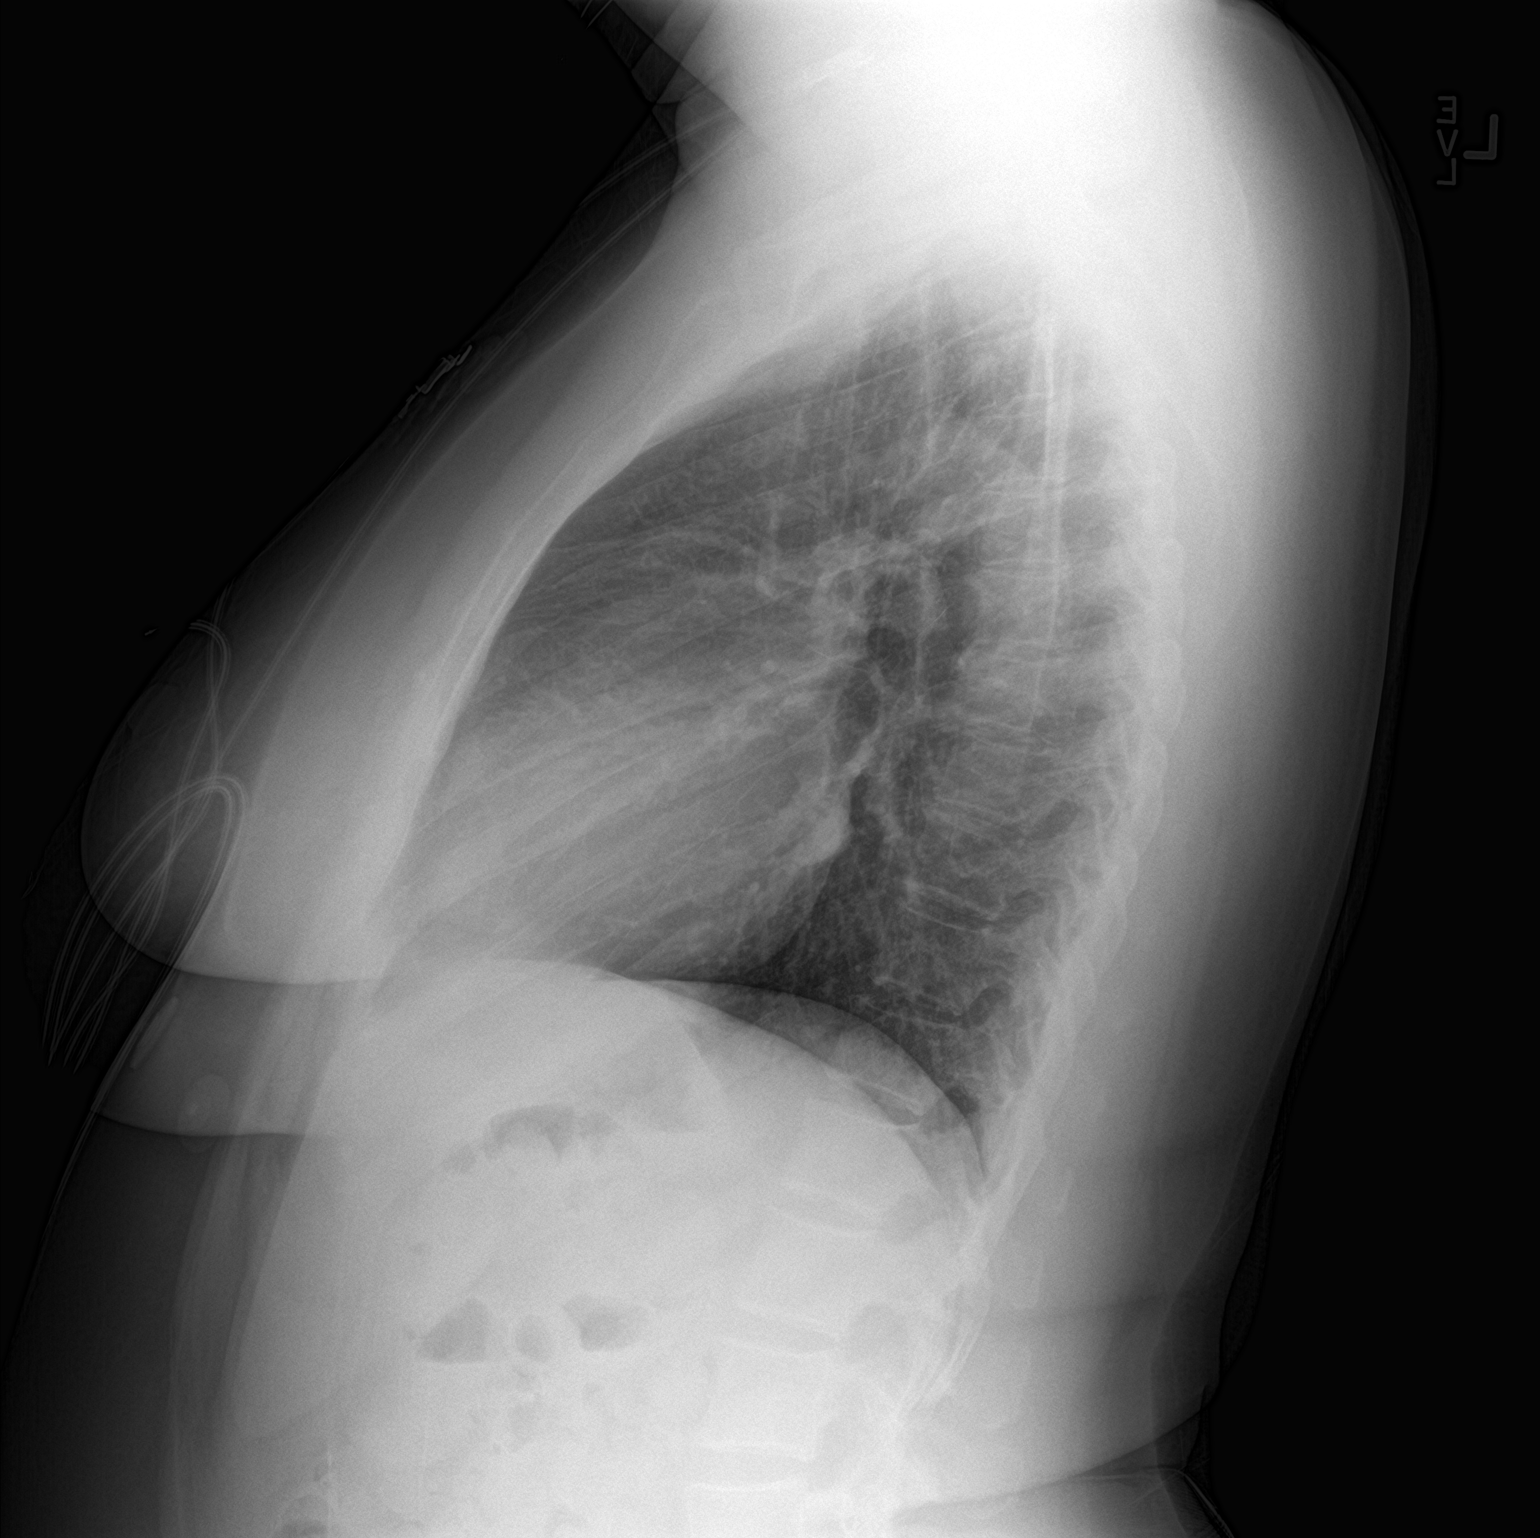

[2 of 2 positions shown; findings below may reference images not displayed]

FINDINGS: The heart size and mediastinal contours are within normal limits.
Both lungs are clear. The visualized skeletal structures are
unremarkable.
IMPRESSION: No active cardiopulmonary disease.

## 2021-08-03 ENCOUNTER — Emergency Department (HOSPITAL_BASED_OUTPATIENT_CLINIC_OR_DEPARTMENT_OTHER): Payer: No Typology Code available for payment source

## 2021-08-03 ENCOUNTER — Encounter (HOSPITAL_BASED_OUTPATIENT_CLINIC_OR_DEPARTMENT_OTHER): Payer: Self-pay | Admitting: Emergency Medicine

## 2021-08-03 ENCOUNTER — Emergency Department (HOSPITAL_BASED_OUTPATIENT_CLINIC_OR_DEPARTMENT_OTHER)
Admission: EM | Admit: 2021-08-03 | Discharge: 2021-08-03 | Disposition: A | Discharge status: Home / Self Care | Payer: No Typology Code available for payment source | Attending: Emergency Medicine | Admitting: Emergency Medicine

## 2021-08-03 ENCOUNTER — Other Ambulatory Visit: Payer: Self-pay

## 2021-08-03 DIAGNOSIS — E119 Type 2 diabetes mellitus without complications: Secondary | ICD-10-CM | POA: Insufficient documentation

## 2021-08-03 DIAGNOSIS — R110 Nausea: Secondary | ICD-10-CM | POA: Insufficient documentation

## 2021-08-03 DIAGNOSIS — R55 Syncope and collapse: Secondary | ICD-10-CM | POA: Insufficient documentation

## 2021-08-03 DIAGNOSIS — R1013 Epigastric pain: Secondary | ICD-10-CM | POA: Diagnosis not present

## 2021-08-03 DIAGNOSIS — M545 Low back pain, unspecified: Secondary | ICD-10-CM | POA: Insufficient documentation

## 2021-08-03 DIAGNOSIS — R42 Dizziness and giddiness: Secondary | ICD-10-CM | POA: Diagnosis not present

## 2021-08-03 LAB — URINALYSIS, ROUTINE W REFLEX MICROSCOPIC
Bilirubin Urine: NEGATIVE
Glucose, UA: NEGATIVE mg/dL
Hgb urine dipstick: NEGATIVE
Ketones, ur: NEGATIVE mg/dL
Leukocytes,Ua: NEGATIVE
Nitrite: NEGATIVE
Protein, ur: NEGATIVE mg/dL
Specific Gravity, Urine: 1.025 (ref 1.005–1.030)
pH: 6 (ref 5.0–8.0)

## 2021-08-03 LAB — CBC WITH DIFFERENTIAL/PLATELET
Abs Immature Granulocytes: 0.01 10*3/uL (ref 0.00–0.07)
Basophils Absolute: 0 10*3/uL (ref 0.0–0.1)
Basophils Relative: 1 %
Eosinophils Absolute: 0.3 10*3/uL (ref 0.0–0.5)
Eosinophils Relative: 5 %
HCT: 38.7 % (ref 36.0–46.0)
Hemoglobin: 12.4 g/dL (ref 12.0–15.0)
Immature Granulocytes: 0 %
Lymphocytes Relative: 43 %
Lymphs Abs: 2.5 10*3/uL (ref 0.7–4.0)
MCH: 29.4 pg (ref 26.0–34.0)
MCHC: 32 g/dL (ref 30.0–36.0)
MCV: 91.7 fL (ref 80.0–100.0)
Monocytes Absolute: 0.4 10*3/uL (ref 0.1–1.0)
Monocytes Relative: 6 %
Neutro Abs: 2.7 10*3/uL (ref 1.7–7.7)
Neutrophils Relative %: 45 %
Platelets: 265 10*3/uL (ref 150–400)
RBC: 4.22 MIL/uL (ref 3.87–5.11)
RDW: 13.7 % (ref 11.5–15.5)
WBC: 5.8 10*3/uL (ref 4.0–10.5)
nRBC: 0 % (ref 0.0–0.2)

## 2021-08-03 LAB — COMPREHENSIVE METABOLIC PANEL
ALT: 24 U/L (ref 0–44)
AST: 27 U/L (ref 15–41)
Albumin: 3.9 g/dL (ref 3.5–5.0)
Alkaline Phosphatase: 88 U/L (ref 38–126)
Anion gap: 7 (ref 5–15)
BUN: 14 mg/dL (ref 6–20)
CO2: 26 mmol/L (ref 22–32)
Calcium: 9.4 mg/dL (ref 8.9–10.3)
Chloride: 105 mmol/L (ref 98–111)
Creatinine, Ser: 0.77 mg/dL (ref 0.44–1.00)
GFR, Estimated: >60 mL/min (ref >60)
Glucose, Bld: 106 mg/dL — ABNORMAL HIGH (ref 70–99)
Potassium: 3.7 mmol/L (ref 3.5–5.1)
Sodium: 138 mmol/L (ref 135–145)
Total Bilirubin: 0.3 mg/dL (ref 0.3–1.2)
Total Protein: 7.8 g/dL (ref 6.5–8.1)

## 2021-08-03 LAB — CBG MONITORING, ED: Glucose-Capillary: 86 mg/dL (ref 70–99)

## 2021-08-03 LAB — TROPONIN I (HIGH SENSITIVITY): Troponin I (High Sensitivity): 2 ng/L (ref <18)

## 2021-08-03 LAB — LIPASE, BLOOD: Lipase: 38 U/L (ref 11–51)

## 2021-08-03 MED ORDER — ONDANSETRON HCL 4 MG/2ML IJ SOLN: 4.0000 mg | Freq: Once | INTRAMUSCULAR | Status: DC

## 2021-08-03 MED ORDER — SODIUM CHLORIDE 0.9 % IV BOLUS
500.0000 mL | Freq: Once | INTRAVENOUS | Status: AC
  Administered 2021-08-03: 500 mL via INTRAVENOUS

## 2021-08-03 MED ORDER — DICYCLOMINE HCL 20 MG PO TABS: 20.0000 mg | ORAL_TABLET | Freq: Three times a day (TID) | ORAL | 0 refills | Status: AC | PRN

## 2021-08-03 MED ORDER — MORPHINE SULFATE (PF) 4 MG/ML IV SOLN: 4.0000 mg | Freq: Once | INTRAVENOUS | Status: DC

## 2021-08-03 MED ORDER — IOHEXOL 300 MG/ML  SOLN
100.0000 mL | Freq: Once | INTRAMUSCULAR | Status: AC | PRN
  Administered 2021-08-03: 100 mL via INTRAVENOUS

## 2021-08-03 NOTE — ED Notes (Signed)
Trop added to original bloodwork

## 2021-08-03 NOTE — ED Provider Notes (Signed)
[name] Provider Note   CSN: [id number] Arrival date & time: 08/03/21  1849     History  Chief Complaint  Patient presents with   Back Pain    Anne [name] is a 56 y.o. female.   Back Pain Associated symptoms: abdominal pain   Associated symptoms: no chest pain and no fever    Anne [name] is a 56 yo female with past medical history of obesity, type 2 diabetes mellitus, hyperlipidemia, prior history of acute pancreatitis, NAFLD, who presents with acute on chronic worsening epigastric pain with associated back pain.  Notably patient also had a syncopal spell in triage with fall to the ground.    Patient reports history of chronic abdominal pain that has been ongoing for the last year which started after starting Rybelsus.  She had gone up in her Rybelsus dose earlier this year and had worsening abdominal pain for which her PCP did a work-up and she was found to have elevated lipase just over 3 times upper limit of normal and sent to the ED for further evaluation.  She was seen at [place] ED 07/06/21, CT A&P overall reassuring, no acute findings, she does have fatty liver.  Hepatobiliary labs were reassuring, unlikely gallstone pancreatitis, mildly elevated triglyceride level unlikely the cause of her pancreatitis.  She denies alcohol consumption.  EDP suspected semaglutide may be causing her acute pancreatitis and he recommended cessation of the medication and follow-up with PCP.  Today she reports worsening of her chronic abdominal pain which is similar to the pain that sent her to the emergency room in May.  Her abdominal pain worsened after eating lunch, salad, around 1 PM.  She describes it as a deep soreness.  She also has associated left-sided back/flank pain and nausea.  She denies vomiting, fevers, chills, night sweats, constipation, diarrhea, change in BM color. She has been taking Berberine for T2DM since discontinuing Rybelsus and  working on lifestyle modifications to loose weight and manage her chronic conditions.    She also reports episodes of lightheadedness, dizziness over the past several weeks.  Denies chest pain today though endorses chest tightness, denies shortness of breath, denies heart palpitations.  Reports history of anxiety though these symptoms are different from when she has her panic attacks.  In the emergency room lobby, she started to feel lightheaded and dizzy and subsequently fell to the ground.  After fall she was alert and oriented x4 and feeling lethargic.    Home Medications Prior to Admission medications   Medication Sig Start Date End Date Taking? Authorizing Provider  dicyclomine (BENTYL) 20 MG tablet Take 1 tablet (20 mg total) by mouth 3 (three) times daily as needed for spasms. 08/03/21  Yes Wayland Denis, MD  hydrOXYzine (VISTARIL) 25 MG capsule Take 25 mg by mouth daily. 01/16/16   [provider]  oxyCODONE-acetaminophen (PERCOCET/ROXICET) 5-325 MG tablet as directed. Pre-op medication - 2 tabs prior to procedure 01/11/16   [provider]  pantoprazole (PROTONIX) 40 MG tablet Take 40 mg by mouth daily. 12/12/15   [provider]      Allergies    Advil [ibuprofen], Aspirin, and Darvon [propoxyphene hcl]    Review of Systems   Review of Systems  Constitutional:  Positive for fatigue. Negative for chills and fever.  Respiratory:  Positive for chest tightness. Negative for shortness of breath.   Cardiovascular:  Negative for chest pain, palpitations and leg swelling.  Gastrointestinal:  Positive  for abdominal pain and nausea. Negative for abdominal distention, blood in stool, constipation, diarrhea and vomiting.  Musculoskeletal:  Positive for back pain.  Neurological:  Positive for dizziness, syncope and light-headedness.   Physical Exam Updated Vital Signs BP 120/69 (BP Location: Right Arm)   Pulse 68   Temp 99 F (37.2 C) (Oral)   Resp 15   Ht '5\' 6"'$   (1.676 m)   Wt 94.3 kg   LMP 04/05/2013   SpO2 100%   BMI 33.57 kg/m  Physical Exam Constitutional:      General: She is not in acute distress.    Appearance: She is obese. She is not toxic-appearing.  HENT:     Head: Normocephalic and atraumatic.  Eyes:     Extraocular Movements: Extraocular movements intact.  Cardiovascular:     Rate and Rhythm: Normal rate and regular rhythm.     Heart sounds: Normal heart sounds. No murmur heard. Pulmonary:     Effort: Pulmonary effort is normal.     Breath sounds: Normal breath sounds.  Abdominal:     General: Abdomen is flat. Bowel sounds are normal. There is no distension.     Palpations: Abdomen is soft. There is no mass.     Tenderness: There is abdominal tenderness in the epigastric area and periumbilical area. There is no guarding. Negative signs include Murphy's sign.  Neurological:     Mental Status: She is alert.    ED Results / Procedures / Treatments   Labs (all labs ordered are listed, but only abnormal results are displayed) Labs Reviewed  COMPREHENSIVE METABOLIC PANEL - Abnormal; Notable for the following components:      Result Value   Glucose, Bld 106 (*)    All other components within normal limits  CBC WITH DIFFERENTIAL/PLATELET  LIPASE, BLOOD  URINALYSIS, ROUTINE W REFLEX MICROSCOPIC  CBG MONITORING, ED  TROPONIN I (HIGH SENSITIVITY)    EKG None  Radiology DG Chest 2 View  Result Date: 08/03/2021 CLINICAL DATA:  Chest pain EXAM: CHEST - 2 VIEW COMPARISON:  05/29/2020 FINDINGS: The heart size and mediastinal contours are within normal limits. Both lungs are clear. The visualized skeletal structures are unremarkable. IMPRESSION: No active cardiopulmonary disease. Electronically Signed   By: Inez Catalina M.D.   On: 08/03/2021 21:26   CT ABDOMEN PELVIS W CONTRAST  Result Date: 08/03/2021 CLINICAL DATA:  Acute abdominal pain on the left, initial encounter EXAM: CT ABDOMEN AND PELVIS WITH CONTRAST TECHNIQUE:  Multidetector CT imaging of the abdomen and pelvis was performed using the standard protocol following bolus administration of intravenous contrast. RADIATION DOSE REDUCTION: This exam was performed according to the departmental dose-optimization program which includes automated exposure control, adjustment of the mA and/or kV according to patient size and/or use of iterative reconstruction technique. CONTRAST:  124m OMNIPAQUE IOHEXOL 300 MG/ML  SOLN COMPARISON:  07/06/2021 FINDINGS: Lower chest: No acute abnormality. Hepatobiliary: Mild fatty infiltration of the liver is noted. The gallbladder is within normal limits. Pancreas: Pancreas is well visualized and stable in appearance. No inflammatory changes are noted. Spleen: Normal in size without focal abnormality. Adrenals/Urinary Tract: Adrenal glands again demonstrate a left adrenal lesion measuring up to 2 cm stable in appearance from the prior exam. Right adrenal gland is within limits. The kidneys demonstrate a normal enhancement pattern. No renal calculi or obstructive changes are seen. Bladder is partially distended. Stomach/Bowel: Mild diverticular changes noted. No diverticulitis is seen. The appendix is within normal limits. Small bowel and stomach are  unremarkable. Vascular/Lymphatic: Aortic atherosclerosis. No enlarged abdominal or pelvic lymph nodes. Reproductive: Status post hysterectomy. No adnexal masses. Other: No abdominal wall hernia or abnormality. No abdominopelvic ascites. Musculoskeletal: No acute or significant osseous findings. IMPRESSION: Stable left adrenal adenoma as seen on prior MRI from 2016. Diverticulosis without diverticulitis. Mild fatty infiltration of the liver. No other focal abnormality is seen. Electronically Signed   By: Inez Catalina M.D.   On: 08/03/2021 21:26      Medications Ordered in ED Medications  sodium chloride 0.9 % bolus 500 mL (0 mLs Intravenous Stopped 08/03/21 2136)  iohexol (OMNIPAQUE) 300 MG/ML solution  100 mL (100 mLs Intravenous Contrast Given 08/03/21 2057)    ED Course/ Medical Decision Making/ A&P                           Medical Decision Making Amount and/or Complexity of Data Reviewed External Data Reviewed: labs, radiology and notes. Labs: ordered. Radiology: ordered. ECG/medicine tests: ordered and independent interpretation performed.  Risk Prescription drug management.  Anne [name] is a 56 yo female with PMHx of obesity, type 2 diabetes mellitus, hyperlipidemia, prior history of acute pancreatitis, NAFLD, who presented to [facility] ED with acute on chronic worsening epigastric pain with associated back pain. Patient arrived HDS, afebrile with BP 138/92. Her initial lab work was unremarkable, without leukocytosis, normal lipase 38, normal liver enzymes, normal kidney function, normoglycemic with unremarkable UA without ketones. Differential initially included acute pancreatitis though this is less likely with normal lipase. No evidence of DKA on lab work. She reports she takes omeprazole though denies symptoms of acid reflux, reports her abdominal pain and reflux symptoms are different.  Will obtain CT abdomen pelvis to rule out other acute causes of abdominal pain and nausea such as appendicitis, cholecystitis, though less likely. Infectious causes of abdominal pain and nausea such as gastroenteritis, enterocolitis considered though no diarrhea or vomiting and afebrile. EKG, CXR and troponins were obtained to rule out atypical presentation of ACS, arrhythmia, pulmonary causes of chest tightness, given syncopal event earlier today in triage, reported chest tightness today, and episodes of lightheadedness for several weeks. She did hit her head though reports no focal neurologic deficits, no headache, was alert and oriented after the fall, younger than 65, do not feel we need CTH at this time.  Morphine, Zofran and fluid bolus was ordered though patient declined pain and nausea  medication. EKG unremarkable, no evidence of arrhythmia, CXR without active cardiopulmonary disease, blood pressures normal 503U 882C systolic. Troponin obtained and negative, low concern for ACS. No chest pain, no tachycardia, no pleuritis, no SOB, low concern for PE. Suspect chest tightness due to underlying anxiety over worsening abdominal pain. Lipase was normal and no inflammation of pancreas on CT, acute pancreatitis less likely. No signs of acute cholecystitis or elevations in LFTs, low concern for acute biliary disease. Kidney function and electrolytes normal. She is normoglycemic and no signs of DKA on labwork or UA. Patient's CT Abd Pelvis with mild fatty liver, diverticulosis without signs of diverticulitis, and stable left 2cm adrenal adenoma unchanged in size since MRI 2016, no acute findings to explain current worsening in symptoms. Discussed findings with patient and partnered with her in her frustration of not finding an answer to her symptoms. Reassured her that we were not able to find any acute urgent pathology that would require her to stay in the ED this evening. Will will try Bentyl TID PRN  for GI symptoms, patient in agreement with trying this. Given she is HDS and symptoms are managed, she is safe to discharge home. Discussed importance of finding a PCP that she partners with and follows regularly with for these symptoms. Discussed if PCP workup and ED workups have been negative, the next step may be referral to gastroenterologist. Patient voiced understanding, all questions addressed prior to discharge. Discussed ED return precautions.     Final Clinical Impression(s) / ED Diagnoses Final diagnoses:  Epigastric pain    Rx / DC Orders ED Discharge Orders          Ordered    dicyclomine (BENTYL) 20 MG tablet  3 times daily PRN        08/03/21 2149             Wayland Denis, MD 08/04/21 Kelle Darting, MD 08/04/21 1248

## 2021-08-03 NOTE — ED Notes (Signed)
Pt had syncopal event in the lobby, just after triage. Fell down to floor, landed on back and posterior head. A&o4, just lethargic at this time

## 2021-08-03 NOTE — ED Triage Notes (Signed)
Pt c/o left sided back pain that started today. Describes pain as strong, constant, aching, and throbbing. Recently seen at [facility] and dx with pancreatitis. Pt report pain feels the same. Endorses nausea. Denies vomiting, diarrhea.

## 2021-08-03 NOTE — Discharge Instructions (Addendum)
You were evaluated at [facility] ED for abdominal pain.  We obtained labwork and your liver labs levels looked good, your kidney function looked good, your lipase was normal, your urine looked great, your blood cells levels were normal, your blood sugar looked great. We did a CT scan of your abdomen and there were no new concerning findings, specifically no gallbladder disease, no signs of an inflamed pancreas, no inflamed bowels.   We can try Bentyl for abdominal pain, I will send this to your pharmacy. You can take one pill up to three times a day as needed for stomach symptoms. I recommend you eat small meals and drink a lot of water. It might help to keep a diary of your stomach symptoms for your primary care physician to review. I recommend establishing with a new primary care physician  who you have a good understanding with. You may benefit from a referral to see a gastroenterologist or stomach specialist to discuss your symptoms since your PCP and ED departments have not been able to find an answer to your symptoms.   If you have more episodes of fainting, irregular heart rhythm, severe chest pain, worsening severe abdominal pain, intractable vomiting, please come back to the emergency room for re-evaluation.   Thank you for allowing [place] to be part of your care.

## 2021-08-03 NOTE — ED Notes (Signed)
Patient transported to CT 

## 2021-10-08 ENCOUNTER — Emergency Department (HOSPITAL_BASED_OUTPATIENT_CLINIC_OR_DEPARTMENT_OTHER): Payer: No Typology Code available for payment source

## 2021-10-08 ENCOUNTER — Emergency Department (HOSPITAL_BASED_OUTPATIENT_CLINIC_OR_DEPARTMENT_OTHER)
Admission: EM | Admit: 2021-10-08 | Discharge: 2021-10-08 | Disposition: A | Discharge status: Home / Self Care | Payer: No Typology Code available for payment source | Attending: Emergency Medicine | Admitting: Emergency Medicine

## 2021-10-08 ENCOUNTER — Encounter (HOSPITAL_BASED_OUTPATIENT_CLINIC_OR_DEPARTMENT_OTHER): Payer: Self-pay | Admitting: Emergency Medicine

## 2021-10-08 ENCOUNTER — Other Ambulatory Visit: Payer: Self-pay

## 2021-10-08 DIAGNOSIS — R0602 Shortness of breath: Secondary | ICD-10-CM | POA: Insufficient documentation

## 2021-10-08 DIAGNOSIS — M79604 Pain in right leg: Secondary | ICD-10-CM | POA: Diagnosis present

## 2021-10-08 DIAGNOSIS — R079 Chest pain, unspecified: Secondary | ICD-10-CM | POA: Diagnosis not present

## 2021-10-08 LAB — CBC WITH DIFFERENTIAL/PLATELET
Abs Immature Granulocytes: 0.01 10*3/uL (ref 0.00–0.07)
Basophils Absolute: 0 10*3/uL (ref 0.0–0.1)
Basophils Relative: 1 %
Eosinophils Absolute: 0.4 10*3/uL (ref 0.0–0.5)
Eosinophils Relative: 8 %
HCT: 40 % (ref 36.0–46.0)
Hemoglobin: 12.9 g/dL (ref 12.0–15.0)
Immature Granulocytes: 0 %
Lymphocytes Relative: 35 %
Lymphs Abs: 1.8 10*3/uL (ref 0.7–4.0)
MCH: 29.9 pg (ref 26.0–34.0)
MCHC: 32.3 g/dL (ref 30.0–36.0)
MCV: 92.6 fL (ref 80.0–100.0)
Monocytes Absolute: 0.3 10*3/uL (ref 0.1–1.0)
Monocytes Relative: 6 %
Neutro Abs: 2.6 10*3/uL (ref 1.7–7.7)
Neutrophils Relative %: 50 %
Platelets: 297 10*3/uL (ref 150–400)
RBC: 4.32 MIL/uL (ref 3.87–5.11)
RDW: 13.2 % (ref 11.5–15.5)
WBC: 5.1 10*3/uL (ref 4.0–10.5)
nRBC: 0 % (ref 0.0–0.2)

## 2021-10-08 LAB — BASIC METABOLIC PANEL
Anion gap: 6 (ref 5–15)
BUN: 19 mg/dL (ref 6–20)
CO2: 27 mmol/L (ref 22–32)
Calcium: 9.2 mg/dL (ref 8.9–10.3)
Chloride: 104 mmol/L (ref 98–111)
Creatinine, Ser: 0.87 mg/dL (ref 0.44–1.00)
GFR, Estimated: >60 mL/min (ref >60)
Glucose, Bld: 108 mg/dL — ABNORMAL HIGH (ref 70–99)
Potassium: 4.1 mmol/L (ref 3.5–5.1)
Sodium: 137 mmol/L (ref 135–145)

## 2021-10-08 LAB — D-DIMER, QUANTITATIVE: D-Dimer, Quant: <0.27 ug/mL-FEU (ref 0.00–0.50)

## 2021-10-08 LAB — PREGNANCY, URINE: Preg Test, Ur: NEGATIVE

## 2021-10-08 LAB — BRAIN NATRIURETIC PEPTIDE: B Natriuretic Peptide: 27.4 pg/mL (ref 0.0–100.0)

## 2021-10-08 LAB — TROPONIN I (HIGH SENSITIVITY): Troponin I (High Sensitivity): <2 ng/L (ref <18)

## 2021-10-08 MED ORDER — LORAZEPAM 2 MG/ML IJ SOLN: 1.0000 mg | INTRAMUSCULAR | Status: DC | PRN

## 2021-10-08 NOTE — ED Notes (Signed)
Dc instructions reviewed with pt no questions or concerns at this time. Will follow up with pcp as needed.

## 2021-10-08 NOTE — ED Provider Notes (Signed)
[facility] EMERGENCY DEPARTMENT Provider Note   CSN: [id number] Arrival date & time: 10/08/21  1547     History  Chief Complaint  Patient presents with   Leg Pain    Anne [name] is a 56 y.o. female.   Leg Pain   Patient is a 56 year old female presenting today due to right lower extremity pain, chest pain and shortness of breath.  Started about a week ago, she describes the chest pressure is more of a pressure than pain, it is left-sided without radiation.  Initially was intermittent, is been constant for the last 3 to 4 days.  Denies any pleuritic nature or positional component.  No nausea or vomiting, she does not feel short of breath but feels like it difficult to take a complete breath.  The right lower extremity pain is mostly behind her right knee as well as on the lateral side of her right ankle.  Denies any trauma to it, the pain is worse with any weightbearing.  Does not smoke cigarettes, seen by the [place] and told to go to ED for DVT work-up.  Pertinent negatives also include no hemoptysis, abdominal pain, lateralized weakness or numbness, vision changes, fevers.  Patient is never had a blood clot, does endorse air travel to [place] last weekend.  Not anticoagulated, not on medicine for hypertension or hyperlipidemia.  No family history of CAD before the age of 28.  Home Medications Prior to Admission medications   Medication Sig Start Date End Date Taking? Authorizing Provider  dicyclomine (BENTYL) 20 MG tablet Take 1 tablet (20 mg total) by mouth 3 (three) times daily as needed for spasms. 08/03/21   Wayland Denis, MD  hydrOXYzine (VISTARIL) 25 MG capsule Take 25 mg by mouth daily. 01/16/16   [provider]  oxyCODONE-acetaminophen (PERCOCET/ROXICET) 5-325 MG tablet as directed. Pre-op medication - 2 tabs prior to procedure 01/11/16   [provider]  pantoprazole (PROTONIX) 40 MG tablet Take 40 mg by mouth daily. 12/12/15    [provider]      Allergies    Advil [ibuprofen], Aspirin, and Darvon [propoxyphene hcl]    Review of Systems   Review of Systems  Physical Exam Updated Vital Signs BP 130/84 (BP Location: Right Arm)   Pulse 80   Temp 98.4 F (36.9 C) (Oral)   Resp 18   Ht '5\' 6"'$  (1.676 m)   Wt 98.9 kg   LMP 04/05/2013   SpO2 99%   BMI 35.19 kg/m  Physical Exam Vitals and nursing note reviewed. Exam conducted with a chaperone present.  Constitutional:      Appearance: Normal appearance.  HENT:     Head: Normocephalic and atraumatic.  Eyes:     General: No scleral icterus.       Right eye: No discharge.        Left eye: No discharge.     Extraocular Movements: Extraocular movements intact.     Pupils: Pupils are equal, round, and reactive to light.  Cardiovascular:     Rate and Rhythm: Normal rate and regular rhythm.     Pulses: Normal pulses.     Heart sounds: Normal heart sounds. No murmur heard.    No friction rub. No gallop.  Pulmonary:     Effort: Pulmonary effort is normal. No respiratory distress.     Breath sounds: Normal breath sounds.  Abdominal:     General: Abdomen is flat. Bowel sounds are normal. There is no distension.  Palpations: Abdomen is soft.     Tenderness: There is no abdominal tenderness.  Musculoskeletal:        General: Tenderness present.     Comments: Right lower extremity with posterior knee tenderness.  Full ROM, some tenderness over the lateral malleolus of right ankle  Skin:    General: Skin is warm and dry.     Coloration: Skin is not jaundiced.  Neurological:     Mental Status: She is alert. Mental status is at baseline.     Coordination: Coordination normal.     ED Results / Procedures / Treatments   Labs (all labs ordered are listed, but only abnormal results are displayed) Labs Reviewed  BASIC METABOLIC PANEL - Abnormal; Notable for the following components:      Result Value   Glucose, Bld 108 (*)    All other  components within normal limits  CBC WITH DIFFERENTIAL/PLATELET  BRAIN NATRIURETIC PEPTIDE  PREGNANCY, URINE  D-DIMER, QUANTITATIVE  TROPONIN I (HIGH SENSITIVITY)    EKG None  Radiology DG Ankle Complete Right  Result Date: 10/08/2021 CLINICAL DATA:  Ankle pain.  Limp.  Slight swelling. EXAM: RIGHT ANKLE - COMPLETE 3+ VIEW COMPARISON:  None Available. FINDINGS: There is no evidence of fracture, dislocation, or joint effusion. There is no evidence of arthropathy or other focal bone abnormality. Soft tissues are unremarkable. IMPRESSION: Negative. Electronically Signed   By: Dorise Bullion III M.D.   On: 10/08/2021 17:35   DG Chest 2 View  Result Date: 10/08/2021 CLINICAL DATA:  Chest pain EXAM: CHEST - 2 VIEW COMPARISON:  Chest x-ray 08/03/2021 FINDINGS: The heart size and mediastinal contours are within normal limits. Both lungs are clear. The visualized skeletal structures are unremarkable. IMPRESSION: No active cardiopulmonary disease. Electronically Signed   By: Ronney Asters M.D.   On: 10/08/2021 17:02   US Venous Img Lower Unilateral Right  Result Date: 10/08/2021 CLINICAL DATA:  RIGHT lower extremity swelling EXAM: RIGHT LOWER EXTREMITY VENOUS DOPPLER ULTRASOUND TECHNIQUE: Gray-scale sonography with compression, as well as color and duplex ultrasound, were performed to evaluate the deep venous system(s) from the level of the common femoral vein through the popliteal and proximal calf veins. COMPARISON:  CT AP 08/03/2021. FINDINGS: VENOUS Normal compressibility of the RIGHT common femoral, superficial femoral, and popliteal veins, as well as the visualized calf veins. Visualized portions of profunda femoral vein and great saphenous vein unremarkable. No filling defects to suggest DVT on grayscale or color Doppler imaging. Doppler waveforms show normal direction of venous flow, normal respiratory plasticity and response to augmentation. Limited views of the contralateral common femoral  vein are unremarkable. OTHER No evidence of superficial thrombophlebitis or abnormal fluid collection. Limitations: none IMPRESSION: No evidence of femoropopliteal DVT or superficial thrombophlebitis within the RIGHT lower extremity. Michaelle Birks, MD [facility] Electronically Signed   By: Michaelle Birks M.D.   On: 10/08/2021 16:55    Procedures Procedures    Medications Ordered in ED Medications - No data to display  ED Course/ Medical Decision Making/ A&P                           Medical Decision Making Amount and/or Complexity of Data Reviewed Labs: ordered. Radiology: ordered.   Patient presents due to chest pressure and right lower extremity pain.  Differential includes but not limited to ACS, PE, DVT, pneumonia, CHF, esophageal rupture, aortic dissection, septic joint, ischemic limb.  On exam patient is neurovascularly intact with brisk cap refill and DP and PT 2+.  Lower extremity is warm without any overlying skin discoloration.  Not consistent with ischemic limb.  Lungs clear to auscultation bilaterally, S1-S2.  Upper and lower extremity pulses symmetric and 2+.  Vitals are also reassuring, no tachycardia or hypoxia.  Patient is not febrile and blood pressure is stable at 133/90.  I ordered and reviewed laboratory work-up. Per my interpretation.  CBC without leukocytosis or anemia.  BMP without gross electrolyte derangement or AKI.  Troponin and BNP are all negative, dimer is negative, not a PE..  I ordered and viewed EKG, patient is in sinus rhythm without any ischemic findings.  Patient is on cardiac monitoring and is on sinus rhythm with a rate of 84 in the room on my interpretation.  I ordered and reviewed plain film of chest, right ankle, and duplex ultrasound of right lower extremity.  Negative for DVT, no fracture or dislocation on right ankle film, chest x-ray is unremarkable without any acute process.  I agree with  radiologist interpretation.  Patient is resting comfortably, on reevaluation symptoms have improved without any medical intervention.  I considered but do not think this is ACS, but given negative troponin and no ischemic findings on EKG I do not think that is likely.  Patient is stabilized, appropriate for outpatient follow-up.  Return precautions           Final Clinical Impression(s) / ED Diagnoses Final diagnoses:  Right leg pain  Chest pain, unspecified type    Rx / DC Orders ED Discharge Orders     None         Sherrill Raring, Hershal Coria 10/08/21 2057    Drenda Freeze, MD 10/08/21 2238

## 2021-10-08 NOTE — ED Triage Notes (Signed)
Pt arrives pov, slow, limping gait, c/o RT knee and ankle pain. Also endorses shob. Also reports ne rx of losartan. Pt reports chest pressure and HA, concern for DVT or PE. Referred by [facility] triage nurseline. Pt speaking in complete sentences. Pt also c/o right arm "burning sensation"

## 2021-10-08 NOTE — Discharge Instructions (Signed)
You are seen in the emergency department for chest pressure and right lower extremity pain.  Your work-up today was reassuringly normal, no signs of heart attack or blood clots.  No pneumonia, please follow-up with your primary for symptoms persist on Monday for reevaluation.  Return to the emergency department if things change or worsen.

## 2021-10-08 NOTE — Progress Notes (Signed)
Tried to get pt for CXR, provider with pt, [place] waiting for imaging, EN made aware, will try back.

## 2021-11-15 ENCOUNTER — Emergency Department (HOSPITAL_BASED_OUTPATIENT_CLINIC_OR_DEPARTMENT_OTHER)
Admission: EM | Admit: 2021-11-15 | Discharge: 2021-11-15 | Disposition: A | Discharge status: Home / Self Care | Payer: No Typology Code available for payment source | Attending: Emergency Medicine | Admitting: Emergency Medicine

## 2021-11-15 ENCOUNTER — Other Ambulatory Visit: Payer: Self-pay

## 2021-11-15 ENCOUNTER — Encounter (HOSPITAL_BASED_OUTPATIENT_CLINIC_OR_DEPARTMENT_OTHER): Payer: Self-pay | Admitting: Emergency Medicine

## 2021-11-15 DIAGNOSIS — Z20822 Contact with and (suspected) exposure to covid-19: Secondary | ICD-10-CM | POA: Insufficient documentation

## 2021-11-15 DIAGNOSIS — J069 Acute upper respiratory infection, unspecified: Secondary | ICD-10-CM

## 2021-11-15 DIAGNOSIS — R519 Headache, unspecified: Secondary | ICD-10-CM | POA: Diagnosis present

## 2021-11-15 LAB — SARS CORONAVIRUS 2 BY RT PCR: SARS Coronavirus 2 by RT PCR: NEGATIVE

## 2021-11-15 MED ORDER — ONDANSETRON 4 MG PO TBDP: ORAL_TABLET | ORAL | 0 refills | Status: AC

## 2021-11-15 MED ORDER — BENZONATATE 100 MG PO CAPS: 100.0000 mg | ORAL_CAPSULE | Freq: Three times a day (TID) | ORAL | 0 refills | Status: AC

## 2021-11-15 MED ORDER — AZITHROMYCIN 250 MG PO TABS: 250.0000 mg | ORAL_TABLET | Freq: Every day | ORAL | 0 refills | Status: AC

## 2021-11-15 NOTE — ED Provider Notes (Signed)
[place] EMERGENCY DEPARTMENT Provider Note   CSN: [id number] Arrival date & time: 11/15/21  0813     History  Chief Complaint  Patient presents with   Headache    fatigue    Anne [name] is a 56 y.o. female.  56 yo F with a chief complaints of cough congestion fever chills myalgias going on for about 48 hours.  No known sick contacts.  Able to eat and drink but less than normal.  No international travel.  No abdominal pain.   Headache      Home Medications Prior to Admission medications   Medication Sig Start Date End Date Taking? Authorizing Provider  azithromycin (ZITHROMAX) 250 MG tablet Take 1 tablet (250 mg total) by mouth daily. Take first 2 tablets together, then 1 every day until finished. 11/15/21  Yes Deno Etienne, DO  benzonatate (TESSALON) 100 MG capsule Take 1 capsule (100 mg total) by mouth every 8 (eight) hours. 11/15/21  Yes Deno Etienne, DO  ondansetron (ZOFRAN-ODT) 4 MG disintegrating tablet '4mg'$  ODT q4 hours prn nausea/vomit 11/15/21  Yes Deno Etienne, DO  dicyclomine (BENTYL) 20 MG tablet Take 1 tablet (20 mg total) by mouth 3 (three) times daily as needed for spasms. 08/03/21   Wayland Denis, MD  hydrOXYzine (VISTARIL) 25 MG capsule Take 25 mg by mouth daily. 01/16/16   [provider]  oxyCODONE-acetaminophen (PERCOCET/ROXICET) 5-325 MG tablet as directed. Pre-op medication - 2 tabs prior to procedure 01/11/16   [provider]  pantoprazole (PROTONIX) 40 MG tablet Take 40 mg by mouth daily. 12/12/15   [provider]      Allergies    Penicillins, Advil [ibuprofen], Aspirin, and Darvon [propoxyphene hcl]    Review of Systems   Review of Systems  Neurological:  Positive for headaches.    Physical Exam Updated Vital Signs BP 128/70 (BP Location: Right Arm)   Pulse 74   Temp 98.7 F (37.1 C) (Oral)   Resp 18   LMP 04/05/2013   SpO2 96%  Physical Exam Vitals and nursing note reviewed.  Constitutional:       General: She is not in acute distress.    Appearance: She is well-developed. She is not diaphoretic.  HENT:     Head: Normocephalic and atraumatic.     Comments: Swollen turbinates, posterior nasal drip, left frontal sinus is exquisitely tender to percussion, tm normal bilaterally.   Eyes:     Pupils: Pupils are equal, round, and reactive to light.  Cardiovascular:     Rate and Rhythm: Normal rate and regular rhythm.     Heart sounds: No murmur heard.    No friction rub. No gallop.  Pulmonary:     Effort: Pulmonary effort is normal.     Breath sounds: No wheezing or rales.  Abdominal:     General: There is no distension.     Palpations: Abdomen is soft.     Tenderness: There is no abdominal tenderness.  Musculoskeletal:        General: No tenderness.     Cervical back: Normal range of motion and neck supple.  Skin:    General: Skin is warm and dry.  Neurological:     Mental Status: She is alert and oriented to person, place, and time.  Psychiatric:        Behavior: Behavior normal.     ED Results / Procedures / Treatments   Labs (all labs ordered are listed, but only abnormal results are  displayed) Labs Reviewed  SARS CORONAVIRUS 2 BY RT PCR    EKG None  Radiology No results found.  Procedures Procedures    Medications Ordered in ED Medications - No data to display  ED Course/ Medical Decision Making/ A&P                           Medical Decision Making Risk Prescription drug management.   56 yo F with a chief complaints of cough congestion fevers chills myalgias headache going on for couple days now.  She is well-appearing nontoxic.  She does have some focal sinus tenderness could be consistent with acute sinusitis.  Otherwise more consistent with a viral syndrome.  Will treat supportively.  Course of antibiotics for possible sinusitis.  PCP follow-up.  9:02 AM:  I have discussed the diagnosis/risks/treatment options with the patient.  Evaluation and  diagnostic testing in the emergency department does not suggest an emergent condition requiring admission or immediate intervention beyond what has been performed at this time.  They will follow up with  PCP. We also discussed returning to the ED immediately if new or worsening sx occur. We discussed the sx which are most concerning (e.g., sudden worsening pain, fever, inability to tolerate by mouth) that necessitate immediate return. Medications administered to the patient during their visit and any new prescriptions provided to the patient are listed below.  Medications given during this visit Medications - No data to display   The patient appears reasonably screen and/or stabilized for discharge and I doubt any other medical condition or other [facility] requiring further screening, evaluation, or treatment in the ED at this time prior to discharge.          Final Clinical Impression(s) / ED Diagnoses Final diagnoses:  Viral upper respiratory tract infection    Rx / DC Orders ED Discharge Orders          Ordered    ondansetron (ZOFRAN-ODT) 4 MG disintegrating tablet        11/15/21 0846    benzonatate (TESSALON) 100 MG capsule  Every 8 hours        11/15/21 0846    azithromycin (ZITHROMAX) 250 MG tablet  Daily        11/15/21 0846              Deno Etienne, DO 11/15/21 0902

## 2021-11-15 NOTE — ED Notes (Signed)
ED Provider at bedside. 

## 2021-11-15 NOTE — Discharge Instructions (Signed)
Take tylenol 2 pills 4 times a day and motrin 4 pills 3 times a day.  Drink plenty of fluids.  Return for worsening shortness of breath, headache, confusion. Follow up with your family doctor.   

## 2021-11-15 NOTE — ED Triage Notes (Signed)
H/A x 3 days, SOB, sweating, fatigue

## 2021-11-28 ENCOUNTER — Emergency Department (HOSPITAL_BASED_OUTPATIENT_CLINIC_OR_DEPARTMENT_OTHER)
Admission: EM | Admit: 2021-11-28 | Discharge: 2021-11-28 | Disposition: A | Discharge status: Home / Self Care | Payer: No Typology Code available for payment source | Attending: Emergency Medicine | Admitting: Emergency Medicine

## 2021-11-28 ENCOUNTER — Emergency Department (HOSPITAL_BASED_OUTPATIENT_CLINIC_OR_DEPARTMENT_OTHER): Payer: No Typology Code available for payment source

## 2021-11-28 ENCOUNTER — Other Ambulatory Visit: Payer: Self-pay

## 2021-11-28 ENCOUNTER — Encounter (HOSPITAL_BASED_OUTPATIENT_CLINIC_OR_DEPARTMENT_OTHER): Payer: Self-pay | Admitting: Emergency Medicine

## 2021-11-28 DIAGNOSIS — R42 Dizziness and giddiness: Secondary | ICD-10-CM | POA: Insufficient documentation

## 2021-11-28 DIAGNOSIS — R112 Nausea with vomiting, unspecified: Secondary | ICD-10-CM | POA: Diagnosis not present

## 2021-11-28 DIAGNOSIS — E119 Type 2 diabetes mellitus without complications: Secondary | ICD-10-CM | POA: Diagnosis not present

## 2021-11-28 DIAGNOSIS — Z20822 Contact with and (suspected) exposure to covid-19: Secondary | ICD-10-CM | POA: Diagnosis not present

## 2021-11-28 DIAGNOSIS — R55 Syncope and collapse: Secondary | ICD-10-CM | POA: Insufficient documentation

## 2021-11-28 LAB — CBC WITH DIFFERENTIAL/PLATELET
Abs Immature Granulocytes: 0.01 10*3/uL (ref 0.00–0.07)
Basophils Absolute: 0 10*3/uL (ref 0.0–0.1)
Basophils Relative: 1 %
Eosinophils Absolute: 0.2 10*3/uL (ref 0.0–0.5)
Eosinophils Relative: 5 %
HCT: 40.2 % (ref 36.0–46.0)
Hemoglobin: 13.2 g/dL (ref 12.0–15.0)
Immature Granulocytes: 0 %
Lymphocytes Relative: 29 %
Lymphs Abs: 1.3 10*3/uL (ref 0.7–4.0)
MCH: 29.9 pg (ref 26.0–34.0)
MCHC: 32.8 g/dL (ref 30.0–36.0)
MCV: 91.2 fL (ref 80.0–100.0)
Monocytes Absolute: 0.3 10*3/uL (ref 0.1–1.0)
Monocytes Relative: 6 %
Neutro Abs: 2.7 10*3/uL (ref 1.7–7.7)
Neutrophils Relative %: 59 %
Platelets: 288 10*3/uL (ref 150–400)
RBC: 4.41 MIL/uL (ref 3.87–5.11)
RDW: 13.5 % (ref 11.5–15.5)
WBC: 4.5 10*3/uL (ref 4.0–10.5)
nRBC: 0 % (ref 0.0–0.2)

## 2021-11-28 LAB — TROPONIN I (HIGH SENSITIVITY): Troponin I (High Sensitivity): <2 ng/L (ref <18)

## 2021-11-28 LAB — COMPREHENSIVE METABOLIC PANEL
ALT: 10 U/L (ref 0–44)
AST: 15 U/L (ref 15–41)
Albumin: 3.8 g/dL (ref 3.5–5.0)
Alkaline Phosphatase: 61 U/L (ref 38–126)
Anion gap: 5 (ref 5–15)
BUN: 14 mg/dL (ref 6–20)
CO2: 24 mmol/L (ref 22–32)
Calcium: 9 mg/dL (ref 8.9–10.3)
Chloride: 107 mmol/L (ref 98–111)
Creatinine, Ser: 0.95 mg/dL (ref 0.44–1.00)
GFR, Estimated: >60 mL/min (ref >60)
Glucose, Bld: 104 mg/dL — ABNORMAL HIGH (ref 70–99)
Potassium: 4 mmol/L (ref 3.5–5.1)
Sodium: 136 mmol/L (ref 135–145)
Total Bilirubin: 0.5 mg/dL (ref 0.3–1.2)
Total Protein: 7.3 g/dL (ref 6.5–8.1)

## 2021-11-28 LAB — LIPASE, BLOOD: Lipase: 33 U/L (ref 11–51)

## 2021-11-28 LAB — SARS CORONAVIRUS 2 BY RT PCR: SARS Coronavirus 2 by RT PCR: NEGATIVE

## 2021-11-28 LAB — MAGNESIUM: Magnesium: 1.7 mg/dL (ref 1.7–2.4)

## 2021-11-28 LAB — CBG MONITORING, ED: Glucose-Capillary: 105 mg/dL — ABNORMAL HIGH (ref 70–99)

## 2021-11-28 LAB — T4, FREE: Free T4: 0.93 ng/dL (ref 0.61–1.12)

## 2021-11-28 LAB — TSH: TSH: 1.004 u[IU]/mL (ref 0.350–4.500)

## 2021-11-28 MED ORDER — IOHEXOL 350 MG/ML SOLN
80.0000 mL | Freq: Once | INTRAVENOUS | Status: AC | PRN
  Administered 2021-11-28: 80 mL via INTRAVENOUS

## 2021-11-28 MED ORDER — SODIUM CHLORIDE 0.9 % IV BOLUS
1000.0000 mL | Freq: Once | INTRAVENOUS | Status: AC
  Administered 2021-11-28: 1000 mL via INTRAVENOUS

## 2021-11-28 NOTE — ED Provider Notes (Signed)
Emergency Medicine Provider Triage Evaluation Note  Anne [name] , a 56 y.o. female  was evaluated in triage.  Pt complains of tightness in her chest cold sweats dizziness weakness malaise and a sensation of feeling like she is going to pass out.  Symptoms started several weeks ago.  Patient has been evaluated several times for this.  No is able to give her an answer and she does not feel like she is getting any better.  She has not been able to work..  Review of Systems  Positive: Headaches, malaise, dizziness, weakness Negative:   Physical Exam  BP 126/78   Pulse 68   Temp 98.1 F (36.7 C) (Oral)   Resp 20   Ht 1.676 m ('5\' 6"'$ )   Wt 102.1 kg   LMP 04/05/2013   SpO2 99%   BMI 36.32 kg/m  Gen:   Awake, alert and oriented Resp:  Normal effort  MSK:   No edema noted bilateral lower extremities, no edema noted upper extremities Other:    Medical Decision Making  Medically screening exam initiated at 1:05 PM.  Appropriate orders placed.  Anne [name] was informed that the remainder of the evaluation will be completed by another provider, this initial triage assessment does not replace that evaluation, and the importance of remaining in the ED until their evaluation is complete.     Dorie Rank, MD 11/28/21 [phone]

## 2021-11-28 NOTE — ED Provider Notes (Signed)
[facility] EMERGENCY DEPARTMENT Provider Note   CSN: [id number] Arrival date & time: 11/28/21  1223     History  Chief Complaint  Patient presents with   Near Syncope    Anne [name] is a 56 y.o. female.   Near Syncope     Patient presents for presyncope x3 weeks.  Started acutely and was initially associated with nausea, headaches and vomiting.  The headache resolved about a week ago, she has been having presyncopal feelings which is sometimes with her tinnitus and other times just feels lightheaded.  Is worse when she goes from laying down to sitting upright but it can last throughout the day.  Denies any vision changes, lateralized weakness or numbness, chest pain.  She does endorse chest tightness, states she is having decreased appetite but denies any specific pain with eating or vomiting.  No recent changes in medicine, she takes medicine for diabetes.  She was seen by neurologist 2 days ago but felt the encounter was unhelpful and like she was not heard.  She describes her symptoms mostly that she just does not feel like herself and cannot function throughout the day.  She usually works but feels unable to do her daily routine secondary to feeling "like I'm under anesthesia".   Past medical history is notable for type 2 diabetes, hyperlipidemia.  Patient's daughter Landry Dyke is at bedside and supports patient has been declining over the last few weeks and normally is a very healthy person.  Home Medications Prior to Admission medications   Medication Sig Start Date End Date Taking? Authorizing Provider  azithromycin (ZITHROMAX) 250 MG tablet Take 1 tablet (250 mg total) by mouth daily. Take first 2 tablets together, then 1 every day until finished. 11/15/21   Deno Etienne, DO  benzonatate (TESSALON) 100 MG capsule Take 1 capsule (100 mg total) by mouth every 8 (eight) hours. 11/15/21   Deno Etienne, DO  dicyclomine (BENTYL) 20 MG tablet Take 1 tablet (20 mg total) by mouth  3 (three) times daily as needed for spasms. 08/03/21   Wayland Denis, MD  hydrOXYzine (VISTARIL) 25 MG capsule Take 25 mg by mouth daily. 01/16/16   [provider]  ondansetron (ZOFRAN-ODT) 4 MG disintegrating tablet '4mg'$  ODT q4 hours prn nausea/vomit 11/15/21   Deno Etienne, DO  oxyCODONE-acetaminophen (PERCOCET/ROXICET) 5-325 MG tablet as directed. Pre-op medication - 2 tabs prior to procedure 01/11/16   [provider]  pantoprazole (PROTONIX) 40 MG tablet Take 40 mg by mouth daily. 12/12/15   [provider]      Allergies    Penicillins, Advil [ibuprofen], Aspirin, and Darvon [propoxyphene hcl]    Review of Systems   Review of Systems  Cardiovascular:  Positive for near-syncope.    Physical Exam Updated Vital Signs BP 126/78   Pulse 68   Temp 98.1 F (36.7 C) (Oral)   Resp 20   Ht '5\' 6"'$  (1.676 m)   Wt 102.1 kg   LMP 04/05/2013   SpO2 99%   BMI 36.32 kg/m  Physical Exam Vitals and nursing note reviewed. Exam conducted with a chaperone present.  Constitutional:      Appearance: Normal appearance.  HENT:     Head: Normocephalic and atraumatic.  Eyes:     General: No scleral icterus.       Right eye: No discharge.        Left eye: No discharge.     Extraocular Movements: Extraocular movements intact.     Pupils:  Pupils are equal, round, and reactive to light.  Cardiovascular:     Rate and Rhythm: Normal rate and regular rhythm.     Pulses: Normal pulses.     Heart sounds: Normal heart sounds. No murmur heard.    No friction rub. No gallop.     Comments: Regular rhythm, upper and lower extremity pulses 2+ symmetric bilaterally Pulmonary:     Effort: Pulmonary effort is normal. No respiratory distress.     Breath sounds: Normal breath sounds.  Abdominal:     General: Abdomen is flat. Bowel sounds are normal. There is no distension.     Palpations: Abdomen is soft.     Tenderness: There is no abdominal tenderness.     Comments: Soft and  nontender  Musculoskeletal:     Cervical back: Normal range of motion. No tenderness.  Skin:    General: Skin is warm and dry.     Coloration: Skin is not jaundiced.  Neurological:     Mental Status: She is alert. Mental status is at baseline.     Coordination: Coordination normal.     Comments: Oriented x3, no dysarthria.  Cranial nerves II through XII are grossly intact, upper and lower extremity strength is symmetric bilaterally 5/5.  Normal finger-nose, no pronator drift.  Sensation light touch is grossly intact.     ED Results / Procedures / Treatments   Labs (all labs ordered are listed, but only abnormal results are displayed) Labs Reviewed  CBG MONITORING, ED - Abnormal; Notable for the following components:      Result Value   Glucose-Capillary 105 (*)    All other components within normal limits  SARS CORONAVIRUS 2 BY RT PCR  CBC WITH DIFFERENTIAL/PLATELET  COMPREHENSIVE METABOLIC PANEL  TSH  URINALYSIS, ROUTINE W REFLEX MICROSCOPIC  T4, FREE  LIPASE, BLOOD  MAGNESIUM  TROPONIN I (HIGH SENSITIVITY)    EKG EKG Interpretation  Date/Time:  Sunday November 28 2021 12:44:41 EDT Ventricular Rate:  66 PR Interval:  166 QRS Duration: 76 QT Interval:  436 QTC Calculation: 457 R Axis:   26 Text Interpretation: Normal sinus rhythm When compared with ECG of 08-Oct-2021 16:13, non specific t wave changes noted Confirmed by Dorie Rank [phone]) on 11/28/2021 12:50:04 PM  Radiology CT Head Wo Contrast  Result Date: 11/28/2021 CLINICAL DATA:  Chronic headaches EXAM: CT HEAD WITHOUT CONTRAST TECHNIQUE: Contiguous axial images were obtained from the base of the skull through the vertex without intravenous contrast. RADIATION DOSE REDUCTION: This exam was performed according to the departmental dose-optimization program which includes automated exposure control, adjustment of the mA and/or kV according to patient size and/or use of iterative reconstruction technique. COMPARISON:   None Available. FINDINGS: Brain: No acute intracranial findings are seen There are no signs of bleeding within the cranium. Ventricles are not dilated. There is decreased density in periventricular and subcortical white matter. Vascular: Unremarkable. Skull: Unremarkable. Sinuses/Orbits: There is opacification of frontal sinus. There is mucosal thickening in ethmoid, sphenoid and left maxillary sinuses. There are no air-fluid levels in paranasal sinuses. Orbits are unremarkable. Other: None. IMPRESSION: No acute intracranial findings are seen in noncontrast CT brain. Chronic frontal, ethmoid and left maxillary sinusitis. Electronically Signed   By: Elmer Picker M.D.   On: 11/28/2021 13:26   DG Chest 2 View  Result Date: 11/28/2021 CLINICAL DATA:  Cough, dizziness EXAM: CHEST - 2 VIEW COMPARISON:  10/08/2021 FINDINGS: The heart size and mediastinal contours are within normal limits. Both lungs are clear.  The visualized skeletal structures are unremarkable. IMPRESSION: No active cardiopulmonary disease. Electronically Signed   By: Elmer Picker M.D.   On: 11/28/2021 13:19    Procedures Procedures    Medications Ordered in ED Medications  sodium chloride 0.9 % bolus 1,000 mL (has no administration in time range)    ED Course/ Medical Decision Making/ A&P                           Medical Decision Making Amount and/or Complexity of Data Reviewed Labs: ordered. Radiology: ordered.  Risk Prescription drug management.   Patient presents due to presyncope.  Differential includes but not limited to arrhythmia, ACS, vertebral artery dissection, hemorrhagic stroke, intracranial lesion, arrhythmia  Physical exam is nonrevealing.  No focal deficits on neuro exam, regular rhythm with normal S1 and S2.  Abdomen is soft and nontender, mentating normal and afebrile.  Does not appear septic. -BP 126/78   Pulse 68   Temp 98.1 F (36.7 C) (Oral)   Resp 20   Ht '5\' 6"'$  (1.676 m)   Wt 102.1  kg   LMP 04/05/2013   SpO2 99%   BMI 36.32 kg/m   I viewed external medical records including note from neurologist 11/26/2021.  Also reviewed MRI the most recent of which was 04/06/2021 which showed excessive white matter changes which per the neurologist looked like excessive posterior ventricular capping.  Neurology wanted to do repeat MRI.  I ordered, reviewed and interpreted laboratory work-up. CBC without leukocytosis or anemia. CMP without gross electrolyte derangement, AKI or transaminitis.  Mildly hyperglycemic at 104. Lipase is within normal limits, not consistent pancreatitis. Magnesium within normal limits at 1.7. Negative troponin, not consistent with ACS. COVID and flu negative. TSH and free T4 collected but are send outs.    I ordered, viewed and interpreted imaging studies. CT head negative for acute process, plain film of chest negative for acute process.  Agree with radiologist interpretation.  CTA head and neck shows no acute process.  EKG shows sinus rhythm, no underlying arrhythmia noted.  Nonspecific T wave changes noted without reciprocal ischemic findings.  Patient is on cardiac monitoring showing sinus rhythm with rate of 78 per my interpretation.  I reviewed patient's home medication list.  I did order IV fluid at patient's request.  Overall work-up is been very reassuring and repeat vital signs have been benign.  His symptoms today has been ongoing for greater than 3 weeks without any obvious etiology and her blood work, imaging studies.  I considered MRI but I do not think emergent repeat is indicated and she does have an outpatient schedule which I encouraged her to keep.  Patient is requesting alternative opinion for neurologist so I did provide a neurology referral.  I also provided cardiology follow-up given she has been having chest tightness and palpitations.   I discussed the work-up results with the patient and her daughter.  We discussed the MRI that was  obtained in February as well as outpatient order for MRI from her current neurologist.  Patient is requesting second neurologist referral which I have provided as well as a cardiologist referral given continued presyncopal feelings.  There is no signs of ACS on work-up, no sign of an acute neurologic emergency.  I do think patient is appropriate for close outpatient follow-up with strict return precautions.  Discussed HPI, physical exam and plan of care for this patient with attending Dorie Rank. The attending physician evaluated  this patient as part of a shared visit and agrees with plan of care.         Final Clinical Impression(s) / ED Diagnoses Final diagnoses:  None    Rx / DC Orders ED Discharge Orders     None         Sherrill Raring, PA-C 11/28/21 1710    Dorie Rank, MD 11/29/21 [phone]

## 2021-11-28 NOTE — Discharge Instructions (Signed)
You were seen today in the emergency department for sensation of being under anesthesia/dizziness/palpitations.  The CTA of your head and neck are negative for any acute process.  The chest x-ray is clear and your blood work is very reassuring.  The TSH of your thyroid function has not resulted yet, please check MyChart for the results.  You have a very reassuring laboratory work-up.  As we discussed I do think you need to closely follow-up with neurology and repeat the MRI your current neurologist ordered, I also put in referrals for a second opinion for neurology as well as cardiology in the outpatient.  If you do not hear from them the next few days to schedule an appointment please call the numbers and schedule them yourself.  Return to the ED for severe pain, weakness on one side of your body, vision changes, new concerning symptoms.

## 2021-11-28 NOTE — ED Triage Notes (Addendum)
Pt reports tightness in chest, cold sweats, dizziness, general weakness; generally not feeling well x 3 wks; felt like she was going to pass pot today

## 2021-11-29 NOTE — Progress Notes (Signed)
Cardiology Office Note:    Date:  11/29/2021   ID:  Anne [name], DOB [birth year], MRN [id number]  PCP:  [facility] Cardiologist:  Lenna Sciara, MD Referring MD: Sherrill Raring, PA-C   Chief Complaint/Reason for Referral:  Presyncope  PATIENT DID NOT APPEAR FOR APPOINTMENT   ASSESSMENT:    1. Precordial pain   2. Dizziness   3. Hyperlipidemia LDL goal <70   4. Hypertension, unspecified type   5. Aortic atherosclerosis ([place])   6. BMI 36.0-36.9,adult     PLAN:    In order of problems listed above:  Chest pain:  We will obtain a coronary CTA and echocardiogram to evaluate further.  If the patient has mild obstructive coronary artery disease, they will require a statin (with goal LDL < 70) and aspirin, if they have high-grade disease we will need to consider optimal medical therapy and if symptoms are refractory to medical therapy, then a cardiac catheterization with possible PCI will be pursued to alleviate symptoms.  If they have high risk disease we will proceed directly to cardiac catheterization.   Dizziness:  Obtain echocardiogram and monitor to evaluate further. Hyperlipidemia:  LDL over the summer was not at goal and she cannot tolerate multiple statins.  Check lipid panel and Lp(a) next weel; refer to pharmacy if LDL > 70. Hypertension:  Given history of T2DM, start losartan '25mg'$  qday and check BMP in one week. 5.   Aortic atherosclerosis:  Start plavix in lieu of ASA given ASA intolerance, lipid therapy as above. 6.   Elevated BMI:  Refer to pharmacy.         Dispo:  No follow-ups on file.         History of Present Illness:    FOCUSED PROBLEM LIST:   T2DM not on insulin; unable to tolerate metformin HL with intolerance to pravastatin and rosuvastatin Rash 2/2 to ASA Hypertension Nonalcoholic fatty liver disease Aortic atherosclerosis on CT A/P 2023 BMI 36  The patient is a 56 y.o. female with the indicated  medical history here for ER follow up regarding dizziness.  The patient was seen in ER June 2023 due to epigastric and back pain.  Trops, EKG, TSH and abd/pelvic CT were reassuring.  She was seen yesterday with reports of chest tightness and dizziness.  She had been seen by Neurology as an outpatient and an MRI demonstrated some white matter changes with plan to repeat MRI in the future.          Current Medications: No outpatient medications have been marked as taking for the 11/30/21 encounter (Appointment) with Early Osmond, MD.     Allergies:    Penicillins, Advil [ibuprofen], Aspirin, and Darvon [propoxyphene hcl]   Social History:   Social History   Tobacco Use   Smoking status: Former    Packs/day: 1.50    Years: 30.00    Total pack years: 45.00    Types: Cigarettes    Quit date: 02/20/2013    Years since quitting: 8.7   Smokeless tobacco: Never   Tobacco comments:    She smoked 1-2 ppd off and on since the age of 39.    Vaping Use   Vaping Use: Never used  Substance Use Topics   Alcohol use: Not Currently   Drug use: No     Family Hx: Family History  Problem Relation Age of Onset   Seizures Mother    Stroke Brother  Arthritis Maternal Grandmother    Diabetes Maternal Grandmother    Heart disease Maternal Grandmother    Hyperlipidemia Maternal Grandmother    Hypertension Maternal Grandmother    Congestive Heart Failure Maternal Grandfather    Diabetes Maternal Grandfather      Review of Systems:   Please see the history of present illness.    All other systems reviewed and are negative.     EKGs/Labs/Other Test Reviewed:    EKG:  EKG performed 9/23 that I personally reviewed demonstrates NSR with NSTT  Prior CV studies:  LE dopplers 2023 Negative  Other studies Reviewed: Review of the additional studies/records demonstrates: CT abd/pelvis 2023 with aortic atherosclerosis  Recent Labs: 10/08/2021: B Natriuretic Peptide 27.4 11/28/2021: ALT  10; BUN 14; Creatinine, Ser 0.95; Hemoglobin 13.2; Magnesium 1.7; Platelets 288; Potassium 4.0; Sodium 136; TSH 1.004   Recent Lipid Panel CareEverywhere 6/23 Chol 198, TG 74, HDL 74, LDL 109  Risk Assessment/Calculations:               Physical Exam:      Signed, Early Osmond, MD  11/29/2021 1:08 PM    [facility] [address], [place], [place]  [address] Phone: [phone]; Fax: [phone]   Note:  This document was prepared using Dragon voice recognition software and may include unintentional dictation errors.

## 2021-11-30 ENCOUNTER — Ambulatory Visit (INDEPENDENT_AMBULATORY_CARE_PROVIDER_SITE_OTHER): Payer: No Typology Code available for payment source | Admitting: Internal Medicine

## 2021-11-30 DIAGNOSIS — I10 Essential (primary) hypertension: Secondary | ICD-10-CM

## 2021-11-30 DIAGNOSIS — R42 Dizziness and giddiness: Secondary | ICD-10-CM

## 2021-11-30 DIAGNOSIS — I700 Atherosclerosis of aorta: Secondary | ICD-10-CM

## 2021-11-30 DIAGNOSIS — Z6836 Body mass index (BMI) 36.0-36.9, adult: Secondary | ICD-10-CM

## 2021-11-30 DIAGNOSIS — E785 Hyperlipidemia, unspecified: Secondary | ICD-10-CM

## 2021-11-30 DIAGNOSIS — R072 Precordial pain: Secondary | ICD-10-CM

## 2021-12-01 ENCOUNTER — Ambulatory Visit: Payer: No Typology Code available for payment source | Admitting: Cardiology

## 2022-09-04 ENCOUNTER — Other Ambulatory Visit: Payer: Self-pay

## 2022-09-04 ENCOUNTER — Encounter (HOSPITAL_BASED_OUTPATIENT_CLINIC_OR_DEPARTMENT_OTHER): Payer: Self-pay

## 2022-09-04 ENCOUNTER — Emergency Department (HOSPITAL_BASED_OUTPATIENT_CLINIC_OR_DEPARTMENT_OTHER)
Admission: EM | Admit: 2022-09-04 | Discharge: 2022-09-04 | Disposition: A | Discharge status: Home / Self Care | Payer: No Typology Code available for payment source | Attending: Emergency Medicine | Admitting: Emergency Medicine

## 2022-09-04 DIAGNOSIS — R0602 Shortness of breath: Secondary | ICD-10-CM | POA: Diagnosis present

## 2022-09-04 DIAGNOSIS — R112 Nausea with vomiting, unspecified: Secondary | ICD-10-CM | POA: Diagnosis not present

## 2022-09-04 DIAGNOSIS — R1013 Epigastric pain: Secondary | ICD-10-CM | POA: Insufficient documentation

## 2022-09-04 DIAGNOSIS — R110 Nausea: Secondary | ICD-10-CM

## 2022-09-04 DIAGNOSIS — Z20822 Contact with and (suspected) exposure to covid-19: Secondary | ICD-10-CM | POA: Insufficient documentation

## 2022-09-04 LAB — COMPREHENSIVE METABOLIC PANEL
ALT: 20 U/L (ref 0–44)
AST: 19 U/L (ref 15–41)
Albumin: 3.8 g/dL (ref 3.5–5.0)
Alkaline Phosphatase: 103 U/L (ref 38–126)
Anion gap: 8 (ref 5–15)
BUN: 13 mg/dL (ref 6–20)
CO2: 25 mmol/L (ref 22–32)
Calcium: 9.2 mg/dL (ref 8.9–10.3)
Chloride: 103 mmol/L (ref 98–111)
Creatinine, Ser: 0.83 mg/dL (ref 0.44–1.00)
GFR, Estimated: >60 mL/min (ref >60)
Glucose, Bld: 131 mg/dL — ABNORMAL HIGH (ref 70–99)
Potassium: 4.3 mmol/L (ref 3.5–5.1)
Sodium: 136 mmol/L (ref 135–145)
Total Bilirubin: 0.5 mg/dL (ref 0.3–1.2)
Total Protein: 7.9 g/dL (ref 6.5–8.1)

## 2022-09-04 LAB — URINALYSIS, ROUTINE W REFLEX MICROSCOPIC
Bilirubin Urine: NEGATIVE
Glucose, UA: NEGATIVE mg/dL
Hgb urine dipstick: NEGATIVE
Ketones, ur: NEGATIVE mg/dL
Leukocytes,Ua: NEGATIVE
Nitrite: NEGATIVE
Protein, ur: NEGATIVE mg/dL
Specific Gravity, Urine: 1.02 (ref 1.005–1.030)
pH: 7 (ref 5.0–8.0)

## 2022-09-04 LAB — D-DIMER, QUANTITATIVE: D-Dimer, Quant: 0.51 ug/mL-FEU — ABNORMAL HIGH (ref 0.00–0.50)

## 2022-09-04 LAB — CBC
HCT: 40.5 % (ref 36.0–46.0)
Hemoglobin: 13.2 g/dL (ref 12.0–15.0)
MCH: 29.4 pg (ref 26.0–34.0)
MCHC: 32.6 g/dL (ref 30.0–36.0)
MCV: 90.2 fL (ref 80.0–100.0)
Platelets: 258 10*3/uL (ref 150–400)
RBC: 4.49 MIL/uL (ref 3.87–5.11)
RDW: 13.2 % (ref 11.5–15.5)
WBC: 5 10*3/uL (ref 4.0–10.5)
nRBC: 0 % (ref 0.0–0.2)

## 2022-09-04 LAB — TROPONIN I (HIGH SENSITIVITY): Troponin I (High Sensitivity): 2 ng/L (ref <18)

## 2022-09-04 LAB — LIPASE, BLOOD: Lipase: 32 U/L (ref 11–51)

## 2022-09-04 LAB — SARS CORONAVIRUS 2 BY RT PCR: SARS Coronavirus 2 by RT PCR: NEGATIVE

## 2022-09-04 MED ORDER — OMEPRAZOLE 20 MG PO CPDR: 20.0000 mg | DELAYED_RELEASE_CAPSULE | Freq: Every day | ORAL | 0 refills | Status: AC

## 2022-09-04 MED ORDER — ONDANSETRON HCL 4 MG/2ML IJ SOLN
4.0000 mg | Freq: Once | INTRAMUSCULAR | Status: AC
  Administered 2022-09-04: 4 mg via INTRAVENOUS
  Filled 2022-09-04: qty 2

## 2022-09-04 MED ORDER — ALUM & MAG HYDROXIDE-SIMETH 200-200-20 MG/5ML PO SUSP
30.0000 mL | Freq: Once | ORAL | Status: AC
  Administered 2022-09-04: 30 mL via ORAL
  Filled 2022-09-04: qty 30

## 2022-09-04 MED ORDER — FAMOTIDINE 20 MG PO TABS
20.0000 mg | ORAL_TABLET | Freq: Once | ORAL | Status: AC
  Administered 2022-09-04: 20 mg via ORAL
  Filled 2022-09-04: qty 1

## 2022-09-04 NOTE — ED Provider Notes (Signed)
[facility]GENCY DEPARTMENT AT [facility] Provider Note   CSN: [id number] Arrival date & time: 09/04/22  9937     History  Chief Complaint  Patient presents with   Shortness of Breath   Emesis    Anne [name] is a 57 y.o. female.  57 year old female with no significant past medical history presenting for complaints of shortness of breath.  Patient admits to shortness of breath at rest that started yesterday.  She denies any chest pain or palpitations.  Denies history of myocardial infarction.  Denies history of DVT or pulmonary embolism.  She denies fevers, chills, coughing.  Denies family history of cardiac issues.  She is a non-smoker.  The history is provided by the patient. No language interpreter was used.  Shortness of Breath Associated symptoms: vomiting   Associated symptoms: no abdominal pain, no chest pain, no cough, no ear pain, no fever, no rash and no sore throat   Emesis Associated symptoms: no abdominal pain, no arthralgias, no chills, no cough, no fever and no sore throat        Home Medications Prior to Admission medications   Medication Sig Start Date End Date Taking? Authorizing Provider  omeprazole (PRILOSEC) 20 MG capsule Take 1 capsule (20 mg total) by mouth daily. 09/04/22  Yes Edwin Dada P, DO  azithromycin (ZITHROMAX) 250 MG tablet Take 1 tablet (250 mg total) by mouth daily. Take first 2 tablets together, then 1 every day until finished. 11/15/21   Melene Plan, DO  benzonatate (TESSALON) 100 MG capsule Take 1 capsule (100 mg total) by mouth every 8 (eight) hours. 11/15/21   Melene Plan, DO  dicyclomine (BENTYL) 20 MG tablet Take 1 tablet (20 mg total) by mouth 3 (three) times daily as needed for spasms. 08/03/21   Ellison Carwin, MD  hydrOXYzine (VISTARIL) 25 MG capsule Take 25 mg by mouth daily. 01/16/16   [provider]  ondansetron (ZOFRAN-ODT) 4 MG disintegrating tablet 4mg  ODT q4 hours prn nausea/vomit 11/15/21   Melene Plan, DO   oxyCODONE-acetaminophen (PERCOCET/ROXICET) 5-325 MG tablet as directed. Pre-op medication - 2 tabs prior to procedure 01/11/16   [provider]  pantoprazole (PROTONIX) 40 MG tablet Take 40 mg by mouth daily. 12/12/15   [provider]      Allergies    Penicillins, Advil [ibuprofen], and Darvon [propoxyphene hcl]    Review of Systems   Review of Systems  Constitutional:  Negative for chills and fever.  HENT:  Negative for ear pain and sore throat.   Eyes:  Negative for pain and visual disturbance.  Respiratory:  Positive for shortness of breath. Negative for cough.   Cardiovascular:  Negative for chest pain and palpitations.  Gastrointestinal:  Positive for vomiting. Negative for abdominal pain.  Genitourinary:  Negative for dysuria and hematuria.  Musculoskeletal:  Negative for arthralgias and back pain.  Skin:  Negative for color change and rash.  Neurological:  Negative for seizures and syncope.  All other systems reviewed and are negative.   Physical Exam Updated Vital Signs BP 138/80   Pulse 63   Temp 98 F (36.7 C) (Oral)   Resp 19   Ht 5\' 6"  (1.676 m)   Wt 105.7 kg   LMP 04/05/2013   SpO2 98%   BMI 37.61 kg/m  Physical Exam Vitals and nursing note reviewed.  Constitutional:      General: She is not in acute distress.    Appearance: She is well-developed.  HENT:  Head: Normocephalic and atraumatic.  Eyes:     Conjunctiva/sclera: Conjunctivae normal.  Cardiovascular:     Rate and Rhythm: Normal rate and regular rhythm.     Heart sounds: No murmur heard. Pulmonary:     Effort: Pulmonary effort is normal. No respiratory distress.     Breath sounds: Normal breath sounds.  Abdominal:     Palpations: Abdomen is soft.     Tenderness: There is no abdominal tenderness.  Musculoskeletal:        General: No swelling.     Cervical back: Neck supple.  Skin:    General: Skin is warm and dry.     Capillary Refill: Capillary refill takes less  than 2 seconds.  Neurological:     Mental Status: She is alert.  Psychiatric:        Mood and Affect: Mood normal.     ED Results / Procedures / Treatments   Labs (all labs ordered are listed, but only abnormal results are displayed) Labs Reviewed  COMPREHENSIVE METABOLIC PANEL - Abnormal; Notable for the following components:      Result Value   Glucose, Bld 131 (*)    All other components within normal limits  D-DIMER, QUANTITATIVE - Abnormal; Notable for the following components:   D-Dimer, Quant 0.51 (*)    All other components within normal limits  SARS CORONAVIRUS 2 BY RT PCR  LIPASE, BLOOD  CBC  URINALYSIS, ROUTINE W REFLEX MICROSCOPIC  TROPONIN I (HIGH SENSITIVITY)    EKG EKG Interpretation Date/Time:  Sunday September 04 2022 11:32:00 EDT Ventricular Rate:  69 PR Interval:  173 QRS Duration:  88 QT Interval:  396 QTC Calculation: 425 R Axis:   19  Text Interpretation: Sinus rhythm Borderline T abnormalities, inferior leads Confirmed by Drema Pry [phone]) on 09/05/2022 6:15:03 PM  Radiology No results found.  Procedures Procedures    Medications Ordered in ED Medications  ondansetron (ZOFRAN) injection 4 mg (4 mg Intravenous Given 09/04/22 1327)  famotidine (PEPCID) tablet 20 mg (20 mg Oral Given 09/04/22 1420)  alum & mag hydroxide-simeth (MAALOX/MYLANTA) 200-200-20 MG/5ML suspension 30 mL (30 mLs Oral Given 09/04/22 1420)    ED Course/ Medical Decision Making/ A&P                             Medical Decision Making Amount and/or Complexity of Data Reviewed Labs: ordered.  Risk OTC drugs. Prescription drug management.   34:49 AM 58 year old female with no significant past medical history presenting for complaints of shortness of breath.  She is alert oriented x 3, no acute distress, afebrile, stable vital signs.  Physical exam demonstrates equal bilateral breath sounds with no adventitious lung sounds.  Regular rate and rhythm.  EKG is interpreted by  myself demonstrates sinus rhythm with a rate of 69 bpm.  No ST segment elevation or depression.  Stable T waves.  Stable intervals.  QTc 425.  D-dimer 0.51.  Low suspicion pulmonary embolism.  Chest x-ray demonstrates no acute process.  No pneumothorax.  No pneumonia.  Patient is well-appearing and on recheck she states that her symptoms may be secondary to acid reflux.  Pepcid and Maalox given in ED.  Omeprazole sent to pharmacy.  Patient questioning whether or not she has H. pylori.  Recommended for close follow-up with primary care physician for testing.  Patient in no distress and overall condition improved here in the ED. Detailed discussions were had with the patient  regarding current findings, and need for close f/u with PCP or on call doctor. The patient has been instructed to return immediately if the symptoms worsen in any way for re-evaluation. Patient verbalized understanding and is in agreement with current care plan. All questions answered prior to discharge.         Final Clinical Impression(s) / ED Diagnoses Final diagnoses:  Dyspepsia  Nausea  SOB (shortness of breath)    Rx / DC Orders ED Discharge Orders          Ordered    omeprazole (PRILOSEC) 20 MG capsule  Daily        09/04/22 1404              Franne Forts, DO 09/10/22 0745

## 2022-09-04 NOTE — ED Notes (Signed)
ED Provider at bedside. 

## 2022-09-04 NOTE — ED Triage Notes (Signed)
Patient started having shortness of breath yesterday. Then today having vomiting. She denied fever. She denied ABD pain.

## 2022-09-04 NOTE — Discharge Instructions (Signed)
-  Follow-up with your primary care physician for symptoms if they do not improve in the next 3 to 5 days.  -Prescription sent to your pharmacy to help decrease acid from acid reflux called omeprazole.  -The medications received in the emergency department today are to help treat the symptoms of acid reflux.  These are over-the-counter medications.  If you had relief after receiving her medications today please consider purchasing Pepcid or Maalox.

## 2022-09-04 NOTE — ED Notes (Signed)
Seen in lobby before triage. SpO2 98% on r/a, Resp equal & unlabored, BBS CTA, HR 87.

## 2022-11-12 IMAGING — CT CT ABD-PELV W/ CM
2 of 5 series · 16 of 46 positions shown, 18 images · IV contrast (Omnipaque)
Comparison: 07/06/2021

CLINICAL DATA: Acute abdominal pain on the left, initial encounter

EXAM:
CT ABDOMEN AND PELVIS WITH CONTRAST
TECHNIQUE: Multidetector CT imaging of the abdomen and pelvis was performed
using the standard protocol following bolus administration of
intravenous contrast.

[Series 2: axial st · axial · 0.98mm/px · z∈[-616,-216]mm · 13 of 90 slices shown, 15 images]
[im 5/90  soft-tissue]
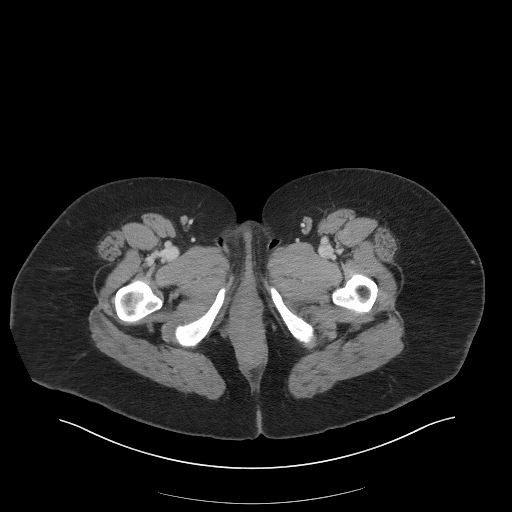
[im 5/90  bone]
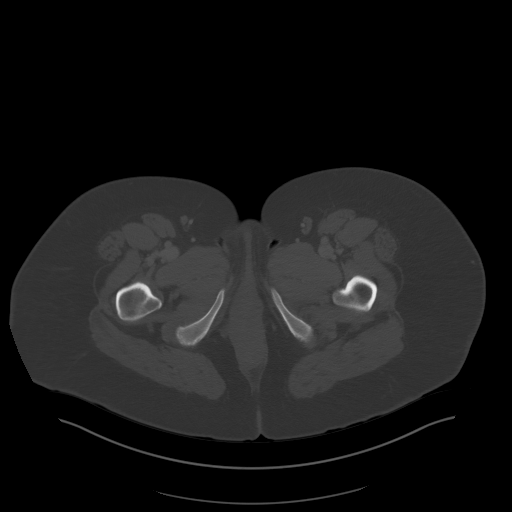
[im 15/90  soft-tissue]
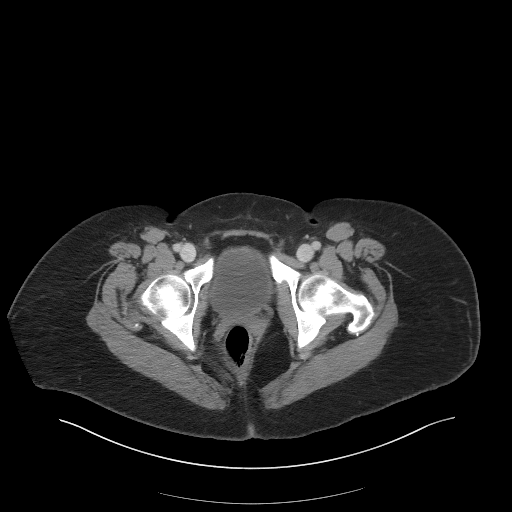
[im 19/90  soft-tissue]
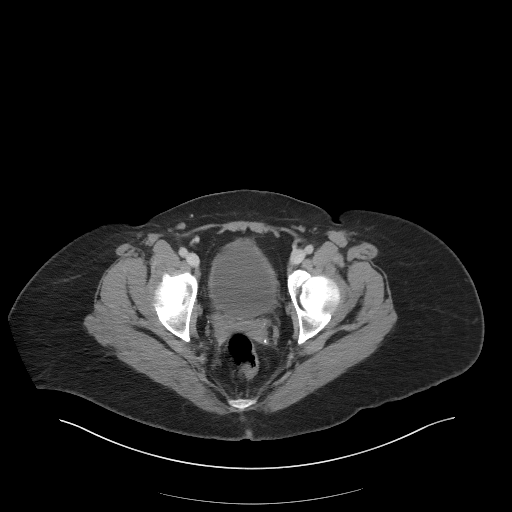
[im 24/90  soft-tissue]
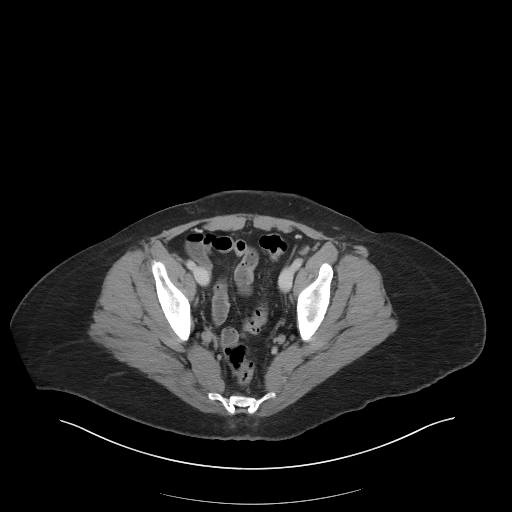
[im 33/90  soft-tissue]
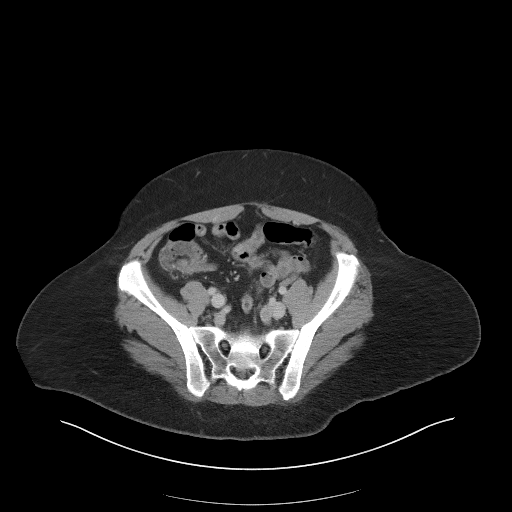
[im 38/90  soft-tissue]
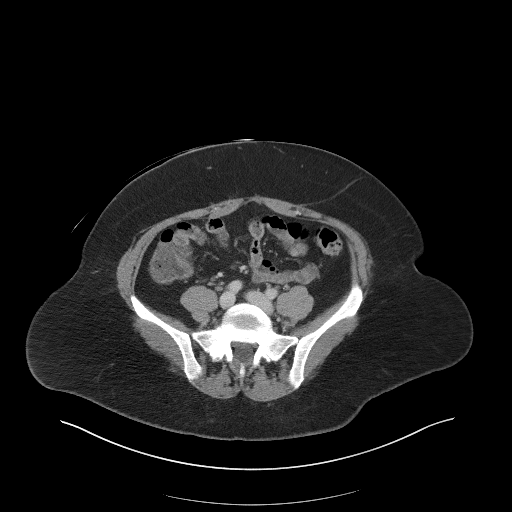
[im 47/90  soft-tissue]
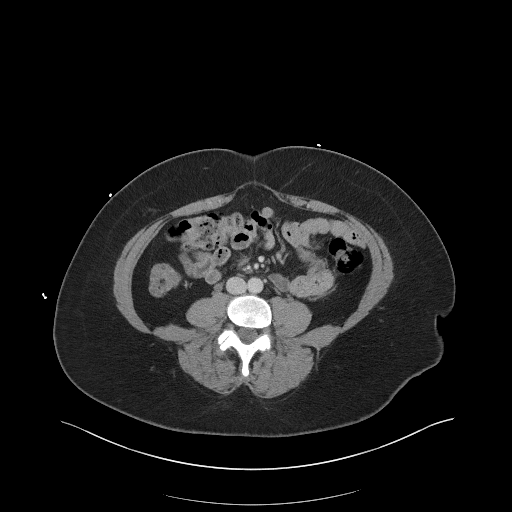
[im 52/90  soft-tissue]
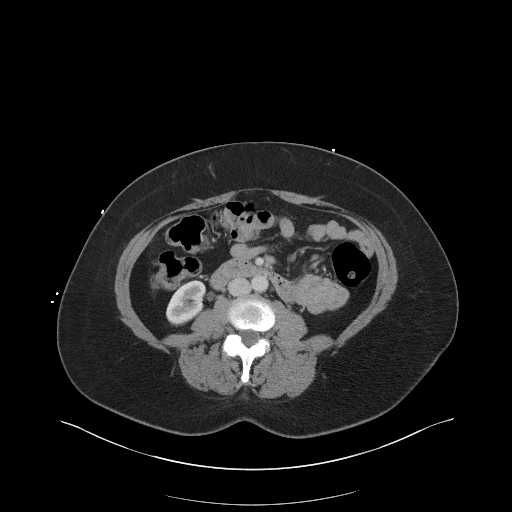
[im 57/90  soft-tissue]
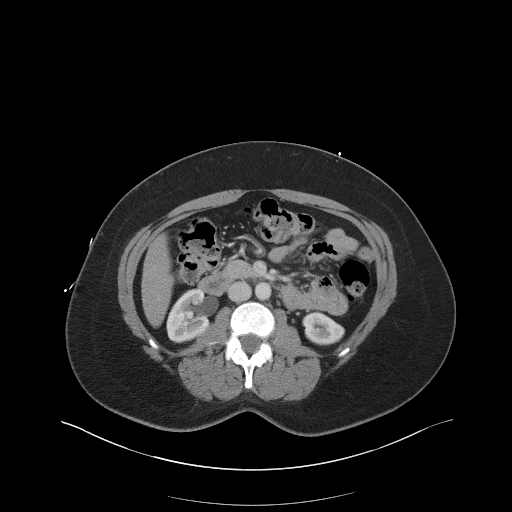
[im 57/90  bone]
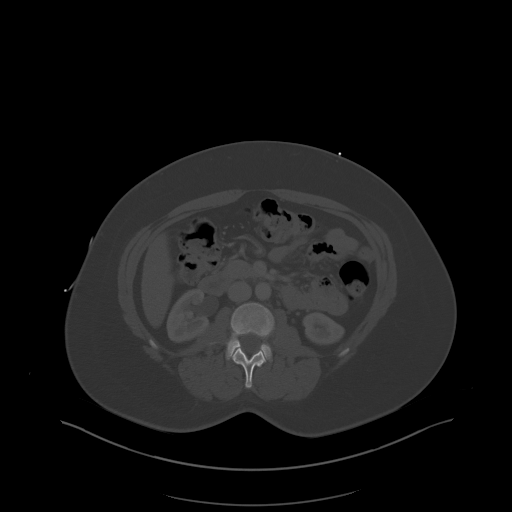
[im 66/90  soft-tissue]
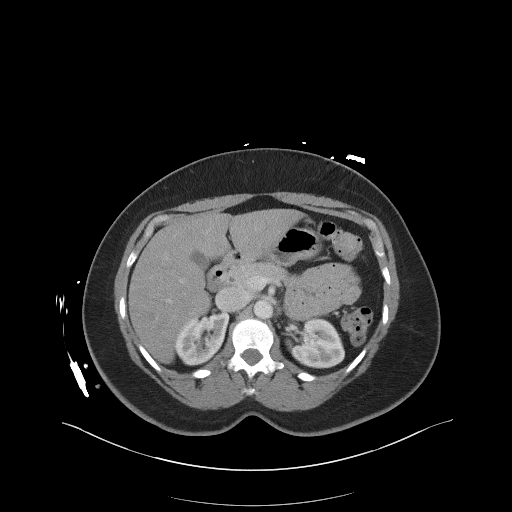
[im 71/90  soft-tissue]
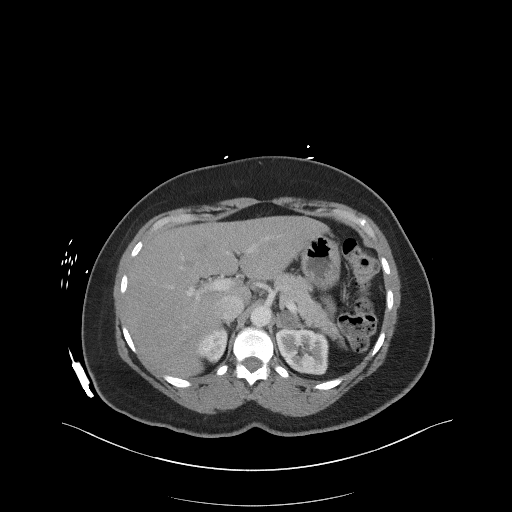
[im 75/90  soft-tissue]
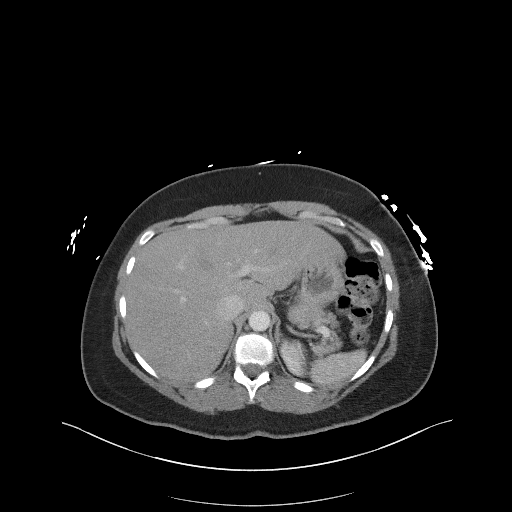
[im 85/90  soft-tissue]
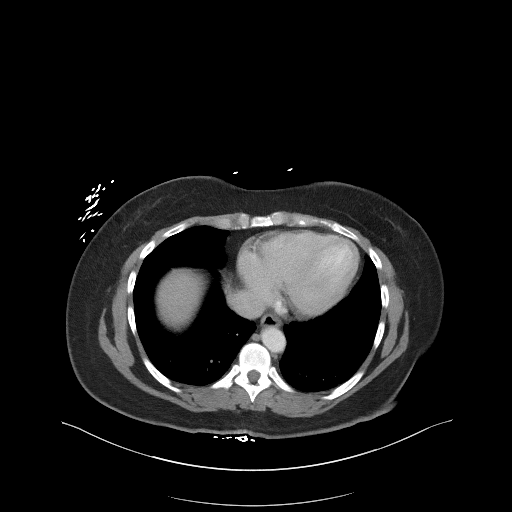

[Series 5: coronal st · coronal · 0.73mm/px · 3 of 101 slices shown]
[im 34/101  soft-tissue]
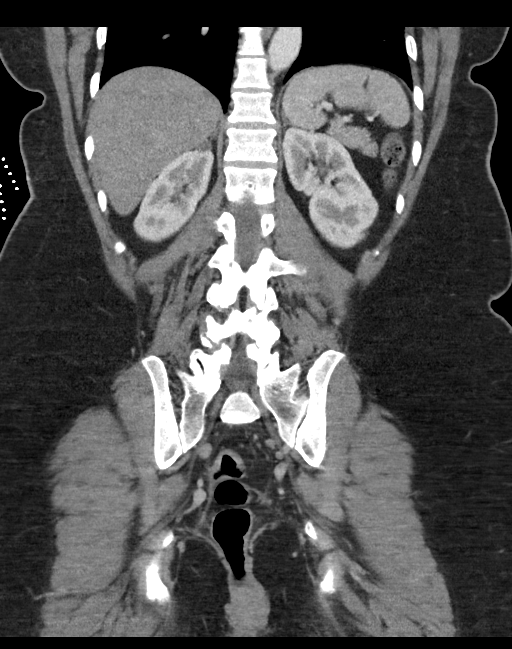
[im 45/101  soft-tissue]
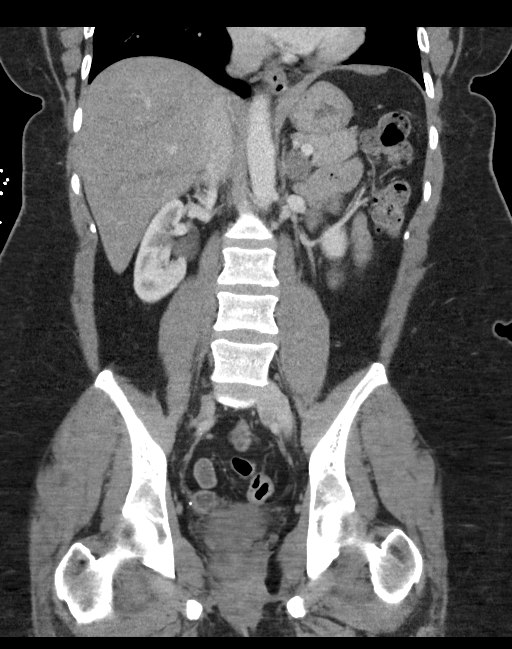
[im 56/101  soft-tissue]
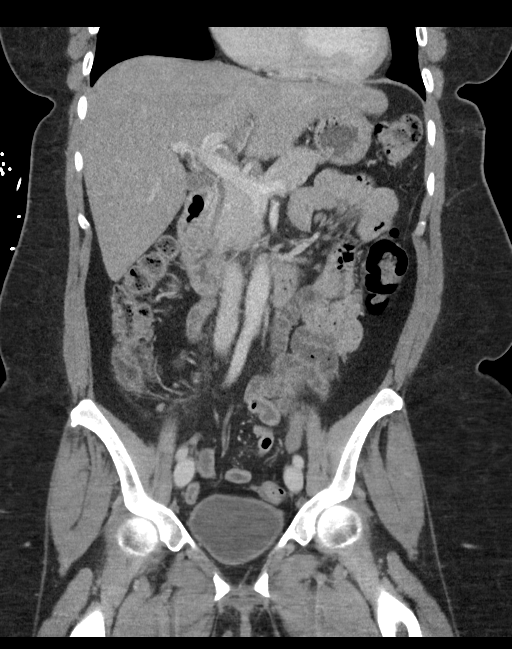

[16 of 46 positions shown; findings below may reference images not displayed]

RADIATION DOSE REDUCTION: This exam was performed according to the
departmental dose-optimization program which includes automated
exposure control, adjustment of the mA and/or kV according to
patient size and/or use of iterative reconstruction technique.

CONTRAST:  100mL OMNIPAQUE IOHEXOL 300 MG/ML  SOLN
FINDINGS: Lower chest: No acute abnormality.

Hepatobiliary: Mild fatty infiltration of the liver is noted. The
gallbladder is within normal limits.

Pancreas: Pancreas is well visualized and stable in appearance. No
inflammatory changes are noted.

Spleen: Normal in size without focal abnormality.

Adrenals/Urinary Tract: Adrenal glands again demonstrate a left
adrenal lesion measuring up to 2 cm stable in appearance from the
prior exam. Right adrenal gland is within limits. The kidneys
demonstrate a normal enhancement pattern. No renal calculi or
obstructive changes are seen. Bladder is partially distended.

Stomach/Bowel: Mild diverticular changes noted. No diverticulitis is
seen. The appendix is within normal limits. Small bowel and stomach
are unremarkable.

Vascular/Lymphatic: Aortic atherosclerosis. No enlarged abdominal or
pelvic lymph nodes.

Reproductive: Status post hysterectomy. No adnexal masses.

Other: No abdominal wall hernia or abnormality. No abdominopelvic
ascites.

Musculoskeletal: No acute or significant osseous findings.
IMPRESSION: Stable left adrenal adenoma as seen on prior MRI from 3844.

Diverticulosis without diverticulitis.

Mild fatty infiltration of the liver.

No other focal abnormality is seen.

## 2023-10-12 ENCOUNTER — Emergency Department (HOSPITAL_BASED_OUTPATIENT_CLINIC_OR_DEPARTMENT_OTHER)

## 2023-10-12 ENCOUNTER — Emergency Department (HOSPITAL_BASED_OUTPATIENT_CLINIC_OR_DEPARTMENT_OTHER)
Admission: EM | Admit: 2023-10-12 | Discharge: 2023-10-12 | Disposition: A | Discharge status: Home / Self Care | Source: Skilled Nursing Facility

## 2023-10-12 ENCOUNTER — Encounter (HOSPITAL_BASED_OUTPATIENT_CLINIC_OR_DEPARTMENT_OTHER): Payer: Self-pay | Admitting: Emergency Medicine

## 2023-10-12 ENCOUNTER — Other Ambulatory Visit: Payer: Self-pay

## 2023-10-12 DIAGNOSIS — Z87891 Personal history of nicotine dependence: Secondary | ICD-10-CM | POA: Diagnosis not present

## 2023-10-12 DIAGNOSIS — E119 Type 2 diabetes mellitus without complications: Secondary | ICD-10-CM | POA: Diagnosis not present

## 2023-10-12 DIAGNOSIS — R0789 Other chest pain: Secondary | ICD-10-CM | POA: Insufficient documentation

## 2023-10-12 LAB — PRO BRAIN NATRIURETIC PEPTIDE: Pro Brain Natriuretic Peptide: <36 pg/mL (ref <300.0)

## 2023-10-12 LAB — BASIC METABOLIC PANEL WITH GFR
Anion gap: 13 (ref 5–15)
BUN: 21 mg/dL — ABNORMAL HIGH (ref 6–20)
CO2: 24 mmol/L (ref 22–32)
Calcium: 9.6 mg/dL (ref 8.9–10.3)
Chloride: 102 mmol/L (ref 98–111)
Creatinine, Ser: 0.89 mg/dL (ref 0.44–1.00)
GFR, Estimated: >60 mL/min (ref >60)
Glucose, Bld: 141 mg/dL — ABNORMAL HIGH (ref 70–99)
Potassium: 4.1 mmol/L (ref 3.5–5.1)
Sodium: 138 mmol/L (ref 135–145)

## 2023-10-12 LAB — CBC
HCT: 40.3 % (ref 36.0–46.0)
Hemoglobin: 13 g/dL (ref 12.0–15.0)
MCH: 29.2 pg (ref 26.0–34.0)
MCHC: 32.3 g/dL (ref 30.0–36.0)
MCV: 90.6 fL (ref 80.0–100.0)
Platelets: 276 K/uL (ref 150–400)
RBC: 4.45 MIL/uL (ref 3.87–5.11)
RDW: 13.9 % (ref 11.5–15.5)
WBC: 5.3 K/uL (ref 4.0–10.5)
nRBC: 0 % (ref 0.0–0.2)

## 2023-10-12 LAB — HEPATIC FUNCTION PANEL
ALT: 17 U/L (ref 0–44)
AST: 16 U/L (ref 15–41)
Albumin: 4.3 g/dL (ref 3.5–5.0)
Alkaline Phosphatase: 94 U/L (ref 38–126)
Bilirubin, Direct: 0.1 mg/dL (ref 0.0–0.2)
Indirect Bilirubin: 0.2 mg/dL — ABNORMAL LOW (ref 0.3–0.9)
Total Bilirubin: 0.3 mg/dL (ref 0.0–1.2)
Total Protein: 8.1 g/dL (ref 6.5–8.1)

## 2023-10-12 LAB — TROPONIN T, HIGH SENSITIVITY
Troponin T High Sensitivity: <15 ng/L (ref 0–19)
Troponin T High Sensitivity: <15 ng/L (ref 0–19)

## 2023-10-12 LAB — LIPASE, BLOOD: Lipase: 53 U/L — ABNORMAL HIGH (ref 11–51)

## 2023-10-12 LAB — D-DIMER, QUANTITATIVE: D-Dimer, Quant: <0.27 ug{FEU}/mL (ref 0.00–0.50)

## 2023-10-12 MED ORDER — ALUM & MAG HYDROXIDE-SIMETH 200-200-20 MG/5ML PO SUSP
30.0000 mL | Freq: Once | ORAL | Status: AC
  Administered 2023-10-12: 30 mL via ORAL
  Filled 2023-10-12: qty 30

## 2023-10-12 NOTE — ED Notes (Signed)
 IV Removed

## 2023-10-12 NOTE — ED Provider Notes (Signed)
 Anne [name]   CSN: [id number] Arrival date & time: 10/12/23  9185     Patient presents with: Chest Pain   Anne [name] is a 58 y.o. female.   58 year old female presenting emergency department for chest pain.  Started yesterday.  Associated with palpitations that seem to have come and gone.  No nausea or vomiting.  Nonexertional pain.  Pain does worsen with lying down.  Reports some minor dyspnea.  Low risk for PE based on Wells criteria   Chest Pain      Prior to Admission medications   Medication Sig Start Date End Date Taking? Authorizing Provider  azithromycin  (ZITHROMAX ) 250 MG tablet Take 1 tablet (250 mg total) by mouth daily. Take first 2 tablets together, then 1 every day until finished. 11/15/21   Anne Share, DO  benzonatate  (TESSALON ) 100 MG capsule Take 1 capsule (100 mg total) by mouth every 8 (eight) hours. 11/15/21   Anne Share, DO  dicyclomine  (BENTYL ) 20 MG tablet Take 1 tablet (20 mg total) by mouth 3 (three) times daily as needed for spasms. 08/03/21   [name], Eva, MD  hydrOXYzine  (VISTARIL ) 25 MG capsule Take 25 mg by mouth daily. 01/16/16   [provider]  omeprazole  (PRILOSEC) 20 MG capsule Take 1 capsule (20 mg total) by mouth daily. 09/04/22   Anne Bernarda SQUIBB, DO  ondansetron  (ZOFRAN -ODT) 4 MG disintegrating tablet 4mg  ODT q4 hours prn nausea/vomit 11/15/21   Anne, Dan, DO  oxyCODONE-acetaminophen  (PERCOCET/ROXICET) 5-325 MG tablet as directed. Pre-op medication - 2 tabs prior to procedure 01/11/16   [provider]  pantoprazole (PROTONIX) 40 MG tablet Take 40 mg by mouth daily. 12/12/15   [provider]    Allergies: Advil [ibuprofen] and Darvon [propoxyphene hcl]    Review of Systems  Cardiovascular:  Positive for chest pain.    Updated Vital Signs BP (!) 154/78 (BP Location: Right Arm)   Pulse 65   Temp 97.9 F (36.6 C)   Resp 18   Ht 5' 6 (1.676 m)   Wt  108.9 kg   LMP 04/05/2013   SpO2 100%   BMI 38.74 kg/m   Physical Exam Vitals and nursing [name] reviewed.  Constitutional:      General: She is not in acute distress.    Appearance: She is not toxic-appearing.  HENT:     Head: Normocephalic.  Cardiovascular:     Rate and Rhythm: Normal rate and regular rhythm.     Pulses:          Radial pulses are 2+ on the right side and 2+ on the left side.     Heart sounds: Normal heart sounds.  Pulmonary:     Effort: Pulmonary effort is normal.     Breath sounds: Normal breath sounds. No wheezing or rhonchi.  Musculoskeletal:     Right lower leg: No edema.     Left lower leg: No edema.  Skin:    General: Skin is warm.     Capillary Refill: Capillary refill takes less than 2 seconds.  Neurological:     Mental Status: She is alert.  Psychiatric:        Mood and Affect: Mood normal.        Behavior: Behavior normal.     (all labs ordered are listed, but only abnormal results are displayed) Labs Reviewed  BASIC METABOLIC PANEL WITH GFR - Abnormal; Notable for the following components:  Result Value   Glucose, Bld 141 (*)    BUN 21 (*)    All other components within normal limits  HEPATIC FUNCTION PANEL - Abnormal; Notable for the following components:   Indirect Bilirubin 0.2 (*)    All other components within normal limits  LIPASE, BLOOD - Abnormal; Notable for the following components:   Lipase 53 (*)    All other components within normal limits  CBC  D-DIMER, QUANTITATIVE  PRO BRAIN NATRIURETIC PEPTIDE  TROPONIN T, HIGH SENSITIVITY  TROPONIN T, HIGH SENSITIVITY    EKG: EKG Interpretation Date/Time:  Thursday October 12 2023 08:24:33 EDT Ventricular Rate:  65 PR Interval:  168 QRS Duration:  87 QT Interval:  399 QTC Calculation: 415 R Axis:   22  Text Interpretation: Sinus rhythm Abnormal R-wave progression, early transition Confirmed by Anne [name] [phone]) on 10/12/2023 8:59:27 AM  Radiology: [facility] Chest 2  View Result Date: 10/12/2023 CLINICAL DATA:  58 year old female with chest and shoulder pain. Palpitations. Fatigue. Former smoker. EXAM: CHEST - 2 VIEW COMPARISON:  Chest radiographs 11/28/2021 and earlier. FINDINGS: PA and lateral views 0957 hours. Mildly lower lung volumes compared to previous exams. Normal cardiac size and mediastinal contours. Visualized tracheal air column is within normal limits. Subtle diffuse increased pulmonary interstitium bilaterally. Otherwise the lungs appear stable and clear. No pneumothorax, pleural effusion, confluent lung opacity. Negative visible bowel gas and osseous structures. IMPRESSION: Subtle diffuse increased pulmonary interstitium from previous exams, nonspecific. This might be smoking related, but consider also viral/atypical respiratory infection or mild pulmonary interstitial edema. Electronically Signed   By: Anne Hurst M.D.   On: 10/12/2023 10:33     Procedures   Medications Ordered in the ED  alum & mag hydroxide-simeth (MAALOX/MYLANTA) 200-200-20 MG/5ML suspension 30 mL (30 mLs Oral Given 10/12/23 0920)    Clinical Course as of 10/12/23 1154  Thu Oct 12, 2023  0932 Troponin T High Sensitivity: <15 Reassuring [TY]  0932 D-Dimer, Quant: <0.27 Low risk based on Wells criteria.  With negative D-dimer.  PE unlikely and will forego CTA [TY]  0932 Basic metabolic panel(!) This significant metabolic derangements.  Normal kidney function [TY]  0932 CBC No leukocytosis to suggest infectious process.  No anemia [TY]  1048 DG Chest 2 View IMPRESSION: Subtle diffuse increased pulmonary interstitium from previous exams, nonspecific. This might be smoking related, but consider also viral/atypical respiratory infection or mild pulmonary interstitial edema.   Electronically Signed   By: Anne Hurst M.D.   On: 10/12/2023 10:33   [TY]  1146 Troponin T High Sensitivity: <15 ACS less likely given negative troponin x 2 [TY]  1147 Pro Brain Natriuretic Peptide:  <36.0 BNP not elevated.  Chest x-ray findings likely viral [TY]  1147 Given patient's reassuring workup that she is appropriate for further outpatient management. [TY]  1147 Discharge in stable condition [TY]    Clinical Course User Index [TY] Anne [name] PARAS, DO                                 Medical Decision Making This is a 58 year old female presenting emergency department for chest pain.  Afebrile nontachycardic, slightly hypertensive.  Appears to be normal sinus rhythm on the monitor as interpreted by me.  No prior cardiac history per chart review.  Does have obesity diabetes and former smoker.  History of atypical chest pain.  Positional component MSK versus pericarditis, however EKG without findings  suggestive of pericarditis.  Chest x-ray possible viral infection.  Troponins negative x 2.  ACS unlikely.  Patient low risk for PE, negative D-dimer.  Unlikely abdominal source given negative hepatic function and lipase.  Overall workup reassuring and therefore discharge.  She does [name] that she has been having some palpitations, noted during the emergency department.  Will refer to cardiology for Holter monitor and further evaluation  Amount and/or Complexity of Data Reviewed Labs: ordered. Decision-making details documented in ED Course. Radiology: ordered. Decision-making details documented in ED Course.  Risk OTC drugs.      Final diagnoses:  None    ED Discharge Orders     None          Anne Caron PARAS, DO 10/12/23 1154

## 2023-10-12 NOTE — ED Triage Notes (Signed)
 Pt has been having shoulder pain for a while.  Pt states she started to have chest pressure since yesterday with some palpitations.  No N/V, some fatigue and sob.  Pain worsens with lying down.  Sob worsens with exertion.

## 2023-10-12 NOTE — Discharge Instructions (Signed)
 May take over-the-counter medication such as Tylenol  or ibuprofen for pain.  Please follow-up with your primary doctor.  Return immediately felt fevers, chills, worsening chest pain, shortness of breath, lightheadedness, passout inability to drink due to nausea vomiting or any new or worsening symptoms that are concerning to you.  I have a referral in for cardiology, they should call to schedule an appointment, however if you do not hear from them please call to schedule an appointment.

## 2024-02-21 ENCOUNTER — Other Ambulatory Visit: Payer: Self-pay

## 2024-02-21 ENCOUNTER — Encounter (HOSPITAL_BASED_OUTPATIENT_CLINIC_OR_DEPARTMENT_OTHER): Payer: Self-pay | Admitting: Emergency Medicine

## 2024-02-21 ENCOUNTER — Emergency Department (HOSPITAL_BASED_OUTPATIENT_CLINIC_OR_DEPARTMENT_OTHER)
Admission: EM | Admit: 2024-02-21 | Discharge: 2024-02-21 | Disposition: A | Discharge status: Home / Self Care | Source: Skilled Nursing Facility | Attending: Emergency Medicine | Admitting: Emergency Medicine

## 2024-02-21 ENCOUNTER — Emergency Department (HOSPITAL_BASED_OUTPATIENT_CLINIC_OR_DEPARTMENT_OTHER)

## 2024-02-21 DIAGNOSIS — R1012 Left upper quadrant pain: Secondary | ICD-10-CM | POA: Diagnosis present

## 2024-02-21 DIAGNOSIS — Z794 Long term (current) use of insulin: Secondary | ICD-10-CM | POA: Diagnosis not present

## 2024-02-21 DIAGNOSIS — E119 Type 2 diabetes mellitus without complications: Secondary | ICD-10-CM | POA: Insufficient documentation

## 2024-02-21 DIAGNOSIS — M79671 Pain in right foot: Secondary | ICD-10-CM | POA: Diagnosis not present

## 2024-02-21 DIAGNOSIS — M79672 Pain in left foot: Secondary | ICD-10-CM | POA: Insufficient documentation

## 2024-02-21 DIAGNOSIS — R140 Abdominal distension (gaseous): Secondary | ICD-10-CM | POA: Insufficient documentation

## 2024-02-21 LAB — COMPREHENSIVE METABOLIC PANEL WITH GFR
ALT: 14 U/L (ref 0–44)
AST: 18 U/L (ref 15–41)
Albumin: 4.1 g/dL (ref 3.5–5.0)
Alkaline Phosphatase: 82 U/L (ref 38–126)
Anion gap: 11 (ref 5–15)
BUN: 15 mg/dL (ref 6–20)
CO2: 25 mmol/L (ref 22–32)
Calcium: 9.5 mg/dL (ref 8.9–10.3)
Chloride: 103 mmol/L (ref 98–111)
Creatinine, Ser: 0.92 mg/dL (ref 0.44–1.00)
GFR, Estimated: >60 mL/min
Glucose, Bld: 202 mg/dL — ABNORMAL HIGH (ref 70–99)
Potassium: 3.9 mmol/L (ref 3.5–5.1)
Sodium: 139 mmol/L (ref 135–145)
Total Bilirubin: 0.3 mg/dL (ref 0.0–1.2)
Total Protein: 7.5 g/dL (ref 6.5–8.1)

## 2024-02-21 LAB — CBC
HCT: 42.4 % (ref 36.0–46.0)
Hemoglobin: 13.7 g/dL (ref 12.0–15.0)
MCH: 28.4 pg (ref 26.0–34.0)
MCHC: 32.3 g/dL (ref 30.0–36.0)
MCV: 88 fL (ref 80.0–100.0)
Platelets: 342 K/uL (ref 150–400)
RBC: 4.82 MIL/uL (ref 3.87–5.11)
RDW: 14.3 % (ref 11.5–15.5)
WBC: 5.8 K/uL (ref 4.0–10.5)
nRBC: 0 % (ref 0.0–0.2)

## 2024-02-21 LAB — URINALYSIS, ROUTINE W REFLEX MICROSCOPIC
Bilirubin Urine: NEGATIVE
Glucose, UA: >=500 mg/dL — AB
Hgb urine dipstick: NEGATIVE
Ketones, ur: NEGATIVE mg/dL
Leukocytes,Ua: NEGATIVE
Nitrite: NEGATIVE
Protein, ur: NEGATIVE mg/dL
Specific Gravity, Urine: 1.015 (ref 1.005–1.030)
pH: 5.5 (ref 5.0–8.0)

## 2024-02-21 LAB — URINALYSIS, MICROSCOPIC (REFLEX)

## 2024-02-21 LAB — LIPASE, BLOOD: Lipase: 28 U/L (ref 11–51)

## 2024-02-21 MED ORDER — IOHEXOL 300 MG/ML  SOLN
100.0000 mL | Freq: Once | INTRAMUSCULAR | Status: AC | PRN
  Administered 2024-02-21: 100 mL via INTRAVENOUS

## 2024-02-21 NOTE — ED Triage Notes (Signed)
 Pt reports LUQ pain x 4 days, sent by [facility] for eval. Denies n/v. PMH includes pancreatitis with similar symptoms. Pt denies ETOH consumption.

## 2024-02-21 NOTE — Discharge Instructions (Addendum)
 It was a pleasure meeting with you today.  As we discussed there were no acute findings on your CT imaging today.  Your blood work and urinalysis were also unremarkable for acute findings.  I recommend continuing to monitor your symptoms and logging when they occur and following up with primary care for ongoing evaluation and management.  Please return for further evaluation if you develop persistent fevers, nausea/vomiting, change in bowels, shortness of breath, chest pain, loss of consciousness, dizziness, worsening pain, or any other new or concerning symptoms.  For your bilateral foot pain, I agree with your current active referral to podiatry for further evaluation and management.

## 2024-02-21 NOTE — ED Provider Notes (Signed)
 " [facility] Provider Note   CSN: [id number] Arrival date & time: 02/21/24  1345     Patient presents with: Abdominal Pain   Anne [name] is a 58 y.o. female.  Patient is here for evaluation of left upper quadrant abdominal pain x 4 days with abdominal distention for x 2 weeks, and bilateral foot pain for the last few weeks.  [facility].  She was recommended to come to our facility for higher level of care and evaluation.  She did receive a referral to podiatry for her foot pain.  Patient has a history of pancreatitis.  She reports current pain is similar to when she had pancreatitis in the past.  She does take Jardiance.  She has not had any change in her dosing recently.  She has been on Jardiance for the past year.  She denies any new medications or change in any other medications.  She has daily bowel movements and takes Metamucil as well as high-fiber diet that is primarily plant-based.  She denies any recent nausea, vomiting, diarrhea, constipation, known fevers, cough, congestion, chest pain, or shortness of breath.  She reports the left upper quadrant pain occasionally will moved to her left flank.  She denies any dysuria, hematuria, or foul-smelling urine.  She does report increased urinary frequency and goes on to say that she has been drinking more water lately.  She denies any falls or injuries.  The left upper quadrant pain is persistent with occasional episodes of increased severity.  The increased verity does not seem to be associated with eating or any other definitive activity.  She takes reflux medication daily.  Patient has been having bilateral foot pain, greater in the left than the right, for the last few weeks.  She denies any injuries, trips, or falls.  The pain is worse after standing for prolonged periods.  She reports the pain  improves after her husband gives her foot massages.  She denies any swelling of the feet or ankles.  The history is provided by the patient.  Abdominal Pain Pain location:  LUQ Duration:  4 days      Prior to Admission medications  Medication Sig Start Date End Date Taking? Authorizing Provider  ezetimibe (ZETIA) 10 MG tablet Take 10 mg by mouth daily. 07/13/23  Yes [provider]  insulin degludec (TRESIBA) 200 UNIT/ML FlexTouch Pen Inject 28 Units into the skin daily. 12/13/23  Yes [provider]  phentermine (ADIPEX-P) 37.5 MG tablet Take 37.5 mg by mouth daily. 01/16/24  Yes [provider]  acyclovir (ZOVIRAX) 200 MG capsule Take 400 mg by mouth 2 (two) times daily.    [provider]  azithromycin  (ZITHROMAX ) 250 MG tablet Take 1 tablet (250 mg total) by mouth daily. Take first 2 tablets together, then 1 every day until finished. 11/15/21   Emil Share, DO  benzonatate  (TESSALON ) 100 MG capsule Take 1 capsule (100 mg total) by mouth every 8 (eight) hours. 11/15/21   Floyd, Dan, DO  dicyclomine  (BENTYL ) 20 MG tablet Take 1 tablet (20 mg total) by mouth 3 (three) times daily as needed for spasms. 08/03/21   Zinoviev, Eva, MD  hydrOXYzine  (VISTARIL ) 25 MG capsule Take 25 mg by mouth daily. 01/16/16   [provider]  omeprazole  (PRILOSEC) 20 MG capsule Take 1 capsule (20 mg total) by mouth  daily. 09/04/22   Elnor Hila P, DO  ondansetron  (ZOFRAN -ODT) 4 MG disintegrating tablet 4mg  ODT q4 hours prn nausea/vomit 11/15/21   Floyd, Dan, DO  oxyCODONE-acetaminophen  (PERCOCET/ROXICET) 5-325 MG tablet as directed. Pre-op medication - 2 tabs prior to procedure 01/11/16   [provider]  pantoprazole (PROTONIX) 40 MG tablet Take 40 mg by mouth daily. 12/12/15   [provider]  Vitamin D, Ergocalciferol, (DRISDOL) 1.25 MG (50000 UNIT) CAPS capsule Take 50,000 Units by mouth once a week.    [provider]    Allergies: Advil  [ibuprofen] and Darvon [propoxyphene hcl]    Review of Systems  Gastrointestinal:  Positive for abdominal pain.    Updated Vital Signs BP 132/73   Pulse 70   Temp 98 F (36.7 C)   Resp 19   Ht 5' 6 (1.676 m)   Wt 112.5 kg   LMP 04/05/2013   SpO2 100%   BMI 40.03 kg/m   Physical Exam Vitals and nursing note reviewed.  Constitutional:      General: She is not in acute distress.    Appearance: Normal appearance. She is well-developed. She is obese. She is not ill-appearing, toxic-appearing or diaphoretic.  HENT:     Head: Normocephalic and atraumatic.     Nose: Nose normal.     Mouth/Throat:     Mouth: Mucous membranes are moist.  Eyes:     General: No scleral icterus.    Extraocular Movements: Extraocular movements intact.     Conjunctiva/sclera: Conjunctivae normal.  Cardiovascular:     Rate and Rhythm: Normal rate and regular rhythm.     Pulses: Normal pulses.     Heart sounds: Normal heart sounds.  Pulmonary:     Effort: Pulmonary effort is normal. No respiratory distress.     Breath sounds: Normal breath sounds. No stridor. No wheezing, rhonchi or rales.  Abdominal:     General: Abdomen is flat. Bowel sounds are normal. There is no distension.     Palpations: Abdomen is soft.     Tenderness: There is abdominal tenderness in the left upper quadrant. There is no guarding or rebound.  Musculoskeletal:        General: Tenderness (Mild tenderness elicited with deep palpation of bottom of bilateral feet.  No obvious injuries or deformities noted.) present. No swelling, deformity or signs of injury. Normal range of motion.     Cervical back: Normal range of motion.     Right lower leg: No edema.     Left lower leg: No edema.  Skin:    General: Skin is warm and dry.     Capillary Refill: Capillary refill takes less than 2 seconds.     Coloration: Skin is not jaundiced or pale.     Findings: No bruising, erythema, lesion or rash.  Neurological:     Mental Status: She  is alert and oriented to person, place, and time.     (all labs ordered are listed, but only abnormal results are displayed) Labs Reviewed  COMPREHENSIVE METABOLIC PANEL WITH GFR - Abnormal; Notable for the following components:      Result Value   Glucose, Bld 202 (*)    All other components within normal limits  URINALYSIS, ROUTINE W REFLEX MICROSCOPIC - Abnormal; Notable for the following components:   Glucose, UA >=500 (*)    All other components within normal limits  URINALYSIS, MICROSCOPIC (REFLEX) - Abnormal; Notable for the following components:   Bacteria, UA RARE (*)  All other components within normal limits  LIPASE, BLOOD  CBC    EKG: EKG Interpretation Date/Time:  Wednesday February 21 2024 17:09:35 EST Ventricular Rate:  72 PR Interval:  174 QRS Duration:  88 QT Interval:  387 QTC Calculation: 424 R Axis:   41  Text Interpretation: Sinus rhythm Abnormal R-wave progression, early transition No significant change since last tracing Confirmed by Dreama Longs ([id number]) on 02/21/2024 5:31:23 PM  Radiology: CT ABDOMEN PELVIS W CONTRAST Result Date: 02/21/2024 CLINICAL DATA:  Abdominal pain. EXAM: CT ABDOMEN AND PELVIS WITH CONTRAST TECHNIQUE: Multidetector CT imaging of the abdomen and pelvis was performed using the standard protocol following bolus administration of intravenous contrast. RADIATION DOSE REDUCTION: This exam was performed according to the departmental dose-optimization program which includes automated exposure control, adjustment of the mA and/or kV according to patient size and/or use of iterative reconstruction technique. CONTRAST:  OMNIPAQUE  IOHEXOL  300 MG/ML  SOLN COMPARISON:  CT abdomen pelvis dated 08/03/2021. FINDINGS: Lower chest: The visualized lungs are clear. No intra-abdominal free air or free fluid. Hepatobiliary: The liver is unremarkable. No biliary dilatation. The gallbladder is unremarkable. Pancreas: Unremarkable. No pancreatic  ductal dilatation or surrounding inflammatory changes. Spleen: Normal in size without focal abnormality. Adrenals/Urinary Tract: The right adrenal glands unremarkable. Indeterminate 2.5 cm left adrenal nodule similar to the study of 2023, likely an adenoma. There is no hydronephrosis on either side. The visualized ureters and urinary bladder are unremarkable. Stomach/Bowel: There is no bowel obstruction or active inflammation. The appendix is normal. Vascular/Lymphatic: Mild atherosclerotic calcification of the abdominal aorta. The IVC is unremarkable. No portal venous gas. There is no adenopathy. Reproductive: Hysterectomy.  No suspicious adnexal mass. Other: Small fat containing umbilical hernia. Musculoskeletal: No acute or significant osseous findings. IMPRESSION: 1. No acute intra-abdominal or pelvic pathology. 2. Aortic Atherosclerosis (ICD10-I70.0). Electronically Signed   By: Vanetta Chou M.D.   On: 02/21/2024 16:50     Procedures   Medications Ordered in the ED  iohexol  (OMNIPAQUE ) 300 MG/ML solution 100 mL (100 mLs Intravenous Contrast Given 02/21/24 1630)     Patient presents to the ED for concern of left upper quadrant abdominal pain, this involves an extensive number of treatment options, and is a complaint that carries with it a high risk of complications and morbidity.  The differential diagnosis includes peptic ulcer disease, acute pancreatitis, UTI, gastroenteritis, splenic rupture, splenic pathology, pyelonephritis.  Lipase is normal lowering our suspicion of acute pancreatitis.  Urinalysis is not indicative of infection lowering our suspicion of UTI or pyelonephritis.  CT imaging does not show any evidence of splenic rupture or acute abnormalities.  Co morbidities that complicate the patient evaluation  History of pancreatitis History of acid reflux Type 2 diabetes   Additional history obtained:  Additional history obtained from Outside Medical Records   External  records from outside source obtained and reviewed including visit to the [place] [facility] same [facility].   Lab Tests:  I Ordered, and personally interpreted labs.  The pertinent results include: Lipase normal.  Blood work largely unremarkable, with elevated glucose of 202.  Urinalysis is not indicative of infection but does show glucose.  Patient is diagnosed with type 2 diabetes.   Imaging Studies ordered:  I ordered imaging studies including CT abdomen pelvis Imaging which showed no acute findings.   Cardiac Monitoring:  The patient was maintained on a cardiac monitor.  I personally viewed and interpreted the cardiac monitored which  showed an underlying rhythm of: Sinus rhythm with no evidence of STEMI.   Problem List / ED Course:     Left upper quadrant abdominal pain.  Blood work, urinalysis, and CT imaging are largely unremarkable for an acute cause of left upper quadrant abdominal pain.  Recommendation to continue monitoring symptoms and keeping a log of the symptoms to share with primary care for ongoing evaluation.  Consideration for referral to outpatient GI if symptoms persist.  Return precautions discussed with patient and patient verbalized understanding.  Stable for discharge. Chronic bilateral foot pain.  Based on history and physical exam findings, I do not feel emergent imaging or workup is necessary at this time.  Patient has already been referred by her primary care physician for podiatry appointment.   Reevaluation:  After the interventions noted above, I reevaluated the patient and found that they have :improved   Dispostion:  After consideration of the diagnostic results and the patients response to treatment, I feel that the patent would benefit from supportive care in the home setting and continue monitoring of symptoms with close follow-up with primary care.  Consideration for outpatient GI referral if symptoms  persist.  Return precautions given.   Medical Decision Making Amount and/or Complexity of Data Reviewed Labs: ordered. Radiology: ordered.  Risk Prescription drug management.   This note was produced using Electronics Engineer. While the provider has reviewed and verified all clinical information, transcription errors may remain.    Final diagnoses:  LUQ abdominal pain  Bilateral foot pain    ED Discharge Orders     None          Jaxsun Ciampi A, PA-C 02/21/24 1745  "
